# Patient Record
Sex: Male | Born: 1965 | Race: White | Hispanic: No | Marital: Married | Smoking: Never smoker
Health system: Southern US, Community
[De-identification: ages and names within clinical notes are randomized; demographics above are authoritative.]

## PROBLEM LIST (undated history)

## (undated) DIAGNOSIS — I10 Essential (primary) hypertension: Secondary | ICD-10-CM

## (undated) HISTORY — PX: ORCHIECTOMY: SHX2116

---

## 1991-03-18 DIAGNOSIS — C6290 Malignant neoplasm of unspecified testis, unspecified whether descended or undescended: Secondary | ICD-10-CM

## 1991-03-18 HISTORY — DX: Malignant neoplasm of unspecified testis, unspecified whether descended or undescended: C62.90

## 2007-03-18 HISTORY — PX: ELBOW ARTHRODESIS: SUR51

## 2009-11-15 ENCOUNTER — Ambulatory Visit (HOSPITAL_BASED_OUTPATIENT_CLINIC_OR_DEPARTMENT_OTHER): Admission: RE | Admit: 2009-11-15 | Discharge: 2009-11-15 | Payer: Self-pay | Admitting: Orthopedic Surgery

## 2010-05-30 LAB — POCT HEMOGLOBIN-HEMACUE: Hemoglobin: 14.6 g/dL (ref 13.0–17.0)

## 2014-02-03 ENCOUNTER — Emergency Department (INDEPENDENT_AMBULATORY_CARE_PROVIDER_SITE_OTHER)
Admission: EM | Admit: 2014-02-03 | Discharge: 2014-02-03 | Disposition: A | Discharge status: Home / Self Care | Payer: Worker's Compensation | Source: Home / Self Care | Attending: Emergency Medicine | Admitting: Emergency Medicine

## 2014-02-03 ENCOUNTER — Encounter: Payer: Self-pay | Admitting: Emergency Medicine

## 2014-02-03 DIAGNOSIS — S39012A Strain of muscle, fascia and tendon of lower back, initial encounter: Secondary | ICD-10-CM

## 2014-02-03 HISTORY — DX: Essential (primary) hypertension: I10

## 2014-02-03 MED ORDER — METHOCARBAMOL 500 MG PO TABS: 500.0000 mg | ORAL_TABLET | Freq: Two times a day (BID) | ORAL | Status: AC

## 2014-02-03 MED ORDER — IBUPROFEN 800 MG PO TABS: 800.0000 mg | ORAL_TABLET | Freq: Three times a day (TID) | ORAL | Status: AC

## 2014-02-03 MED ORDER — HYDROCODONE-ACETAMINOPHEN 5-325 MG PO TABS
2.0000 | ORAL_TABLET | ORAL | Status: DC | PRN
Start: 2014-02-03 — End: 2019-05-11

## 2014-02-03 MED ORDER — KETOROLAC TROMETHAMINE 60 MG/2ML IM SOLN
60.0000 mg | Freq: Once | INTRAMUSCULAR | Status: AC
  Administered 2014-02-03: 60 mg via INTRAMUSCULAR

## 2014-02-03 NOTE — ED Notes (Signed)
Patient was lifting tires and twisting body.  Felt sharp pain to left lower back shooting down left leg.

## 2014-02-03 NOTE — ED Provider Notes (Signed)
CSN: [id number]     Arrival date & time 02/03/14  5329 History   First MD Initiated Contact with Patient 02/03/14 1939     Chief Complaint  Patient presents with  . Back Pain   (Consider location/radiation/quality/duration/timing/severity/associated sxs/prior Treatment) Patient is a 48 y.o. male presenting with back pain. The history is provided by the patient. No language interpreter was used.  Back Pain Location:  Lumbar spine Quality:  Aching and stabbing Radiates to:  L posterior upper leg Pain severity:  Severe Pain is:  Same all the time Onset quality:  Sudden Timing:  Constant Progression:  Worsening Chronicity:  New Context: not recent illness   Relieved by:  Nothing Worsened by:  Nothing tried Ineffective treatments:  None tried Associated symptoms: no abdominal pain   Risk factors: no recent surgery     Past Medical History  Diagnosis Date  . Hypertension    History reviewed. No pertinent past surgical history. History reviewed. No pertinent family history. History  Substance Use Topics  . Smoking status: Never Smoker   . Smokeless tobacco: Not on file  . Alcohol Use: No    Review of Systems  Gastrointestinal: Negative for abdominal pain.  Musculoskeletal: Positive for back pain.  All other systems reviewed and are negative.   Allergies  Review of patient's allergies indicates no known allergies.  Home Medications   Prior to Admission medications   Medication Sig Start Date End Date Taking? Authorizing Provider  LISINOPRIL PO Take by mouth.   Yes Historical Provider, MD   BP 163/105 mmHg  Pulse 61  Temp(Src) 98.2 F (36.8 C) (Oral)  Resp 16  Ht 5\' 9"  (1.753 m)  Wt 175 lb (79.379 kg)  BMI 25.83 kg/m2  SpO2 99% Physical Exam  Constitutional: He appears well-developed and well-nourished.  HENT:  Head: Normocephalic and atraumatic.  Cardiovascular: Normal rate.   Pulmonary/Chest: Effort normal.  Abdominal: Soft.  Musculoskeletal: He  exhibits tenderness.  Tender lower thoracic and upper lumbar area,  Pain with range of motion, decreased range of motion nv and ns intact,    Neurological: He is alert.  Skin: Skin is warm.  Nursing note and vitals reviewed.   ED Course  Procedures (including critical care time) Labs Review Labs Reviewed - No data to display  Imaging Review No results found.   MDM   1. Lumbar spine strain, initial encounter    Hydrocodone Ibuprofen Robaxin Referral to Dr. Darene Lamer  For recheck in 3 weeks.    [place], PA-C 02/03/14 1955

## 2014-02-03 NOTE — Discharge Instructions (Signed)
Back Pain, Adult Low back pain is very common. About 1 in 5 people have back pain.The cause of low back pain is rarely dangerous. The pain often gets better over time.About half of people with a sudden onset of back pain feel better in just 2 weeks. About 8 in 10 people feel better by 6 weeks.  CAUSES Some common causes of back pain include:  Strain of the muscles or ligaments supporting the spine.  Wear and tear (degeneration) of the spinal discs.  Arthritis.  Direct injury to the back. DIAGNOSIS Most of the time, the direct cause of low back pain is not known.However, back pain can be treated effectively even when the exact cause of the pain is unknown.Answering your caregiver's questions about your overall health and symptoms is one of the most accurate ways to make sure the cause of your pain is not dangerous. If your caregiver needs more information, he or she may order lab work or imaging tests (X-rays or MRIs).However, even if imaging tests show changes in your back, this usually does not require surgery. HOME CARE INSTRUCTIONS For many people, back pain returns.Since low back pain is rarely dangerous, it is often a condition that people can learn to manageon their own.   Remain active. It is stressful on the back to sit or stand in one place. Do not sit, drive, or stand in one place for more than 30 minutes at a time. Take short walks on level surfaces as soon as pain allows.Try to increase the length of time you walk each day.  Do not stay in bed.Resting more than 1 or 2 days can delay your recovery.  Do not avoid exercise or work.Your body is made to move.It is not dangerous to be active, even though your back may hurt.Your back will likely heal faster if you return to being active before your pain is gone.  Pay attention to your body when you bend and lift. Many people have less discomfortwhen lifting if they bend their knees, keep the load close to their bodies,and  avoid twisting. Often, the most comfortable positions are those that put less stress on your recovering back.  Find a comfortable position to sleep. Use a firm mattress and lie on your side with your knees slightly bent. If you lie on your back, put a pillow under your knees.  Only take over-the-counter or prescription medicines as directed by your caregiver. Over-the-counter medicines to reduce pain and inflammation are often the most helpful.Your caregiver may prescribe muscle relaxant drugs.These medicines help dull your pain so you can more quickly return to your normal activities and healthy exercise.  Put ice on the injured area.  Put ice in a plastic bag.  Place a towel between your skin and the bag.  Leave the ice on for 15-20 minutes, 03-04 times a day for the first 2 to 3 days. After that, ice and heat may be alternated to reduce pain and spasms.  Ask your caregiver about trying back exercises and gentle massage. This may be of some benefit.  Avoid feeling anxious or stressed.Stress increases muscle tension and can worsen back pain.It is important to recognize when you are anxious or stressed and learn ways to manage it.Exercise is a great option. SEEK MEDICAL CARE IF:  You have pain that is not relieved with rest or medicine.  You have pain that does not improve in 1 week.  You have new symptoms.  You are generally not feeling well. SEEK   IMMEDIATE MEDICAL CARE IF:   You have pain that radiates from your back into your legs.  You develop new bowel or bladder control problems.  You have unusual weakness or numbness in your arms or legs.  You develop nausea or vomiting.  You develop abdominal pain.  You feel faint. Document Released: 03/03/2005 Document Revised: 09/02/2011 Document Reviewed: 07/05/2013 ExitCare Patient Information 2015 [employer]. This information is not intended to replace advice given to you by your health care provider. Make sure you  discuss any questions you have with your health care provider.  

## 2015-03-18 DIAGNOSIS — Z9889 Other specified postprocedural states: Secondary | ICD-10-CM

## 2015-03-18 HISTORY — DX: Other specified postprocedural states: Z98.890

## 2018-07-06 DIAGNOSIS — G894 Chronic pain syndrome: Secondary | ICD-10-CM | POA: Diagnosis not present

## 2018-07-06 DIAGNOSIS — M47812 Spondylosis without myelopathy or radiculopathy, cervical region: Secondary | ICD-10-CM | POA: Diagnosis not present

## 2018-07-06 DIAGNOSIS — M7918 Myalgia, other site: Secondary | ICD-10-CM | POA: Diagnosis not present

## 2018-07-13 DIAGNOSIS — M47812 Spondylosis without myelopathy or radiculopathy, cervical region: Secondary | ICD-10-CM | POA: Diagnosis not present

## 2018-09-01 DIAGNOSIS — M47812 Spondylosis without myelopathy or radiculopathy, cervical region: Secondary | ICD-10-CM | POA: Diagnosis not present

## 2018-09-30 DIAGNOSIS — G894 Chronic pain syndrome: Secondary | ICD-10-CM | POA: Diagnosis not present

## 2018-09-30 DIAGNOSIS — Z79899 Other long term (current) drug therapy: Secondary | ICD-10-CM | POA: Diagnosis not present

## 2018-09-30 DIAGNOSIS — M47812 Spondylosis without myelopathy or radiculopathy, cervical region: Secondary | ICD-10-CM | POA: Diagnosis not present

## 2018-11-02 DIAGNOSIS — M47812 Spondylosis without myelopathy or radiculopathy, cervical region: Secondary | ICD-10-CM | POA: Diagnosis not present

## 2018-11-02 DIAGNOSIS — G894 Chronic pain syndrome: Secondary | ICD-10-CM | POA: Diagnosis not present

## 2018-11-02 DIAGNOSIS — Z5181 Encounter for therapeutic drug level monitoring: Secondary | ICD-10-CM | POA: Diagnosis not present

## 2018-11-02 DIAGNOSIS — Z79899 Other long term (current) drug therapy: Secondary | ICD-10-CM | POA: Diagnosis not present

## 2018-12-01 DIAGNOSIS — M7918 Myalgia, other site: Secondary | ICD-10-CM | POA: Diagnosis not present

## 2018-12-01 DIAGNOSIS — G894 Chronic pain syndrome: Secondary | ICD-10-CM | POA: Diagnosis not present

## 2018-12-01 DIAGNOSIS — M47812 Spondylosis without myelopathy or radiculopathy, cervical region: Secondary | ICD-10-CM | POA: Diagnosis not present

## 2018-12-01 DIAGNOSIS — Z79899 Other long term (current) drug therapy: Secondary | ICD-10-CM | POA: Diagnosis not present

## 2019-01-26 DIAGNOSIS — Z981 Arthrodesis status: Secondary | ICD-10-CM | POA: Diagnosis not present

## 2019-01-26 DIAGNOSIS — G894 Chronic pain syndrome: Secondary | ICD-10-CM | POA: Diagnosis not present

## 2019-01-26 DIAGNOSIS — M5412 Radiculopathy, cervical region: Secondary | ICD-10-CM | POA: Diagnosis not present

## 2019-02-18 DIAGNOSIS — M4312 Spondylolisthesis, cervical region: Secondary | ICD-10-CM | POA: Diagnosis not present

## 2019-02-18 DIAGNOSIS — M2578 Osteophyte, vertebrae: Secondary | ICD-10-CM | POA: Diagnosis not present

## 2019-02-18 DIAGNOSIS — M4722 Other spondylosis with radiculopathy, cervical region: Secondary | ICD-10-CM | POA: Diagnosis not present

## 2019-02-18 DIAGNOSIS — M4802 Spinal stenosis, cervical region: Secondary | ICD-10-CM | POA: Diagnosis not present

## 2019-02-18 DIAGNOSIS — M5412 Radiculopathy, cervical region: Secondary | ICD-10-CM | POA: Diagnosis not present

## 2019-02-18 DIAGNOSIS — Z981 Arthrodesis status: Secondary | ICD-10-CM | POA: Diagnosis not present

## 2019-02-23 DIAGNOSIS — G894 Chronic pain syndrome: Secondary | ICD-10-CM | POA: Diagnosis not present

## 2019-02-23 DIAGNOSIS — M5412 Radiculopathy, cervical region: Secondary | ICD-10-CM | POA: Diagnosis not present

## 2019-03-17 DIAGNOSIS — C44622 Squamous cell carcinoma of skin of right upper limb, including shoulder: Secondary | ICD-10-CM | POA: Diagnosis not present

## 2019-04-20 DIAGNOSIS — I10 Essential (primary) hypertension: Secondary | ICD-10-CM | POA: Diagnosis not present

## 2019-04-20 DIAGNOSIS — Z79899 Other long term (current) drug therapy: Secondary | ICD-10-CM | POA: Diagnosis not present

## 2019-04-20 DIAGNOSIS — I160 Hypertensive urgency: Secondary | ICD-10-CM | POA: Diagnosis not present

## 2019-04-20 DIAGNOSIS — Z5181 Encounter for therapeutic drug level monitoring: Secondary | ICD-10-CM | POA: Diagnosis not present

## 2019-04-20 DIAGNOSIS — R519 Headache, unspecified: Secondary | ICD-10-CM | POA: Diagnosis not present

## 2019-04-20 DIAGNOSIS — J181 Lobar pneumonia, unspecified organism: Secondary | ICD-10-CM | POA: Diagnosis not present

## 2019-04-20 DIAGNOSIS — G894 Chronic pain syndrome: Secondary | ICD-10-CM | POA: Diagnosis not present

## 2019-04-20 DIAGNOSIS — I119 Hypertensive heart disease without heart failure: Secondary | ICD-10-CM | POA: Diagnosis not present

## 2019-04-20 DIAGNOSIS — M5412 Radiculopathy, cervical region: Secondary | ICD-10-CM | POA: Diagnosis not present

## 2019-04-20 DIAGNOSIS — R079 Chest pain, unspecified: Secondary | ICD-10-CM | POA: Diagnosis not present

## 2019-04-20 DIAGNOSIS — R918 Other nonspecific abnormal finding of lung field: Secondary | ICD-10-CM | POA: Diagnosis not present

## 2019-04-20 DIAGNOSIS — Z981 Arthrodesis status: Secondary | ICD-10-CM | POA: Diagnosis not present

## 2019-04-20 DIAGNOSIS — Z20822 Contact with and (suspected) exposure to covid-19: Secondary | ICD-10-CM | POA: Diagnosis not present

## 2019-04-20 DIAGNOSIS — Z8249 Family history of ischemic heart disease and other diseases of the circulatory system: Secondary | ICD-10-CM | POA: Diagnosis not present

## 2019-04-20 DIAGNOSIS — E039 Hypothyroidism, unspecified: Secondary | ICD-10-CM | POA: Diagnosis not present

## 2019-04-20 DIAGNOSIS — R0602 Shortness of breath: Secondary | ICD-10-CM | POA: Diagnosis not present

## 2019-04-21 DIAGNOSIS — R519 Headache, unspecified: Secondary | ICD-10-CM | POA: Diagnosis not present

## 2019-04-21 DIAGNOSIS — R079 Chest pain, unspecified: Secondary | ICD-10-CM | POA: Diagnosis not present

## 2019-04-21 DIAGNOSIS — I340 Nonrheumatic mitral (valve) insufficiency: Secondary | ICD-10-CM | POA: Diagnosis not present

## 2019-04-21 DIAGNOSIS — Z8249 Family history of ischemic heart disease and other diseases of the circulatory system: Secondary | ICD-10-CM | POA: Diagnosis not present

## 2019-04-21 DIAGNOSIS — I517 Cardiomegaly: Secondary | ICD-10-CM | POA: Diagnosis not present

## 2019-04-21 DIAGNOSIS — R0600 Dyspnea, unspecified: Secondary | ICD-10-CM | POA: Diagnosis not present

## 2019-04-21 DIAGNOSIS — I160 Hypertensive urgency: Secondary | ICD-10-CM | POA: Diagnosis not present

## 2019-04-22 DIAGNOSIS — Z1211 Encounter for screening for malignant neoplasm of colon: Secondary | ICD-10-CM | POA: Diagnosis not present

## 2019-04-22 DIAGNOSIS — I10 Essential (primary) hypertension: Secondary | ICD-10-CM | POA: Diagnosis not present

## 2019-04-27 DIAGNOSIS — I10 Essential (primary) hypertension: Secondary | ICD-10-CM | POA: Diagnosis not present

## 2019-05-11 ENCOUNTER — Ambulatory Visit: Payer: BC Managed Care – PPO | Admitting: Cardiology

## 2019-05-11 ENCOUNTER — Encounter: Payer: Self-pay | Admitting: Cardiology

## 2019-05-11 ENCOUNTER — Other Ambulatory Visit: Payer: Self-pay

## 2019-05-11 VITALS — BP 123/98 | HR 70 | Temp 96.0°F | Ht 69.0 in | Wt 195.9 lb

## 2019-05-11 DIAGNOSIS — R0602 Shortness of breath: Secondary | ICD-10-CM | POA: Diagnosis not present

## 2019-05-11 DIAGNOSIS — I10 Essential (primary) hypertension: Secondary | ICD-10-CM

## 2019-05-11 DIAGNOSIS — Z8249 Family history of ischemic heart disease and other diseases of the circulatory system: Secondary | ICD-10-CM

## 2019-05-11 MED ORDER — AMLODIPINE BESYLATE 5 MG PO TABS: 5.0000 mg | ORAL_TABLET | Freq: Every evening | ORAL | 0 refills | Status: DC

## 2019-05-11 MED ORDER — HYDROCHLOROTHIAZIDE 12.5 MG PO CAPS: 12.5000 mg | ORAL_CAPSULE | Freq: Every morning | ORAL | 0 refills | Status: DC

## 2019-05-11 NOTE — Patient Instructions (Signed)
Please remember to bring in your medication bottles in at the next visit.   New Medications that were added at today's visit:  Norvasc 5mg  po qPM HCTZ 12.5mg  po qAM  Medications that were discontinued at today's visit: Nifedipine Atenolol  Please keep a blood pressure log and bring that in at  your next office visit. Call the office if the top number is consistently greater than 169mmHg.   Please get labs done in about 1 week at the nearest [place].  Recommend follow up with your PCP as scheduled.

## 2019-05-11 NOTE — Progress Notes (Signed)
REASON FOR CONSULT: Hypertension, shortness of breath, ER follow-up  Chief Complaint  Patient presents with  . Shortness of Breath  . Hypertension  . Follow-up    REQUESTING PHYSICIAN:  Aura Dials, MD [address],  [place] [address]  HPI  Fred Coleman is a 54 y.o. male who presents to the office with a chief complaint of " shortness of breath, hypertension."  Patient's past medical history and cardiac risk factors include: Hypertension, diastolic dysfunction, premature coronary disease in family.  Benign essential hypertension. Patient states that he was diagnosed with hypertension at the age of 33 and he focused on lifestyle modifications and soon thereafter was taken off of antihypertensive medications.  Patient states this is been about couple years that he is not been on medical therapy for high blood pressures and recently found out in the mid 2020 that he is blood pressure is elevated.  Recently his blood pressures were significantly elevated and he had symptoms of headaches and vision changes for which she went to [place] system for further evaluation.  At the hospital he underwent an echocardiogram and nuclear stress test given his elevated blood pressures and effort related dyspnea.  He was later discharged and is now referred to the office at the request of his primary care physician for further evaluation and management of high blood pressure and shortness of breath.  His current antihypertensive medications include atenolol, lisinopril, nifedipine at the doses mentioned below.  Patient states that his blood pressure now at home is [id number] systolic and diastolic blood pressures range between 95-120 mmHg.    Review of systems positive for: Dyspnea on exertion (better than before). Currently patient denies chest pain, lightheadedness, dizziness, palpitations, orthopnea, paroxysmal nocturnal dyspnea, lower extremity swelling, near syncope, syncopal  events, hematochezia, hemoptysis, hematemesis, melanotic stools, no symptoms of amaurosis fugax, motor or sensory symptoms or dysphasia in the last 6 months.   History of premature coronary artery disease in the family. Denies prior history of coronary artery disease, myocardial infarction, congestive heart failure, deep venous thrombosis, pulmonary embolism, stroke, transient ischemic attack.  ALLERGIES: No Known Allergies   MEDICATION LIST PRIOR TO VISIT: Current Outpatient Medications on File Prior to Visit  Medication Sig Dispense Refill  . LISINOPRIL PO Take 5 mg by mouth 2 (two) times daily.     . methocarbamol (ROBAXIN) 500 MG tablet Take 1 tablet (500 mg total) by mouth 2 (two) times daily. 20 tablet 0  . ibuprofen (ADVIL,MOTRIN) 800 MG tablet Take 1 tablet (800 mg total) by mouth 3 (three) times daily. (Patient not taking: Reported on 05/11/2019) 21 tablet 0   No current facility-administered medications on file prior to visit.    PAST MEDICAL HISTORY: Past Medical History:  Diagnosis Date  . H/O neck surgery 2017  . Hypertension   . Testicular cancer ([place]) 1993    PAST SURGICAL HISTORY: Past Surgical History:  Procedure Laterality Date  . ELBOW ARTHRODESIS  2009  . ORCHIECTOMY      FAMILY HISTORY: The patient family history includes Heart attack in his sister; Heart disease in his father; Hyperlipidemia in his father; Hypertension in his father.   SOCIAL HISTORY:  The patient  reports that he has never smoked. He has never used smokeless tobacco. He reports that he does not drink alcohol or use drugs.  14 ORGAN REVIEW OF SYSTEMS: CONSTITUTIONAL: No fever or significant weight loss EYES: No recent significant visual change EARS, NOSE, MOUTH, THROAT:  No recent significant change in hearing CARDIOVASCULAR: See discussion in subjective/HPI RESPIRATORY: See discussion in subjective/HPI GASTROINTESTINAL: No recent complaints of abdominal pain GENITOURINARY: No recent  significant change in genitourinary status MUSCULOSKELETAL: No recent significant change in musculoskeletal status INTEGUMENTARY: No recent rash NEUROLOGIC: No recent significant change in motor function PSYCHIATRIC: No recent significant change in mood ENDOCRINOLOGIC: No recent significant change in endocrine status HEMATOLOGIC/LYMPHATIC: No recent significant unexpected bruising ALLERGIC/IMMUNOLOGIC: No recent unexplained allergic reaction  PHYSICAL EXAM: Vitals with BMI 05/11/2019 02/03/2014  Height 5\' 9"  5\' 9"   Weight 195 lbs 14 oz 175 lbs  BMI [id number]  Systolic [id number]  Diastolic 98 [id number]  Pulse 70 61   CONSTITUTIONAL: Well-developed and well-nourished. No acute distress.  SKIN: Skin is warm and dry. No rash noted. No cyanosis. No pallor. No jaundice HEAD: Normocephalic and atraumatic.  EYES: No scleral icterus MOUTH/THROAT: Moist oral membranes.  NECK: No JVD present. No thyromegaly noted. No carotid bruits  LYMPHATIC: No visible cervical adenopathy.  CHEST Normal respiratory effort. No intercostal retractions  LUNGS: Clear to auscultation bilaterally.  No stridor. No wheezes. No rales.  CARDIOVASCULAR: Regular rate and rhythm, positive S1-S2, no murmurs rubs or gallops appreciated. ABDOMINAL: No apparent ascites.  EXTREMITIES: Trace bilateral peripheral edema, 2+ bilateral radial, popliteal, posterior tibial pedis HEMATOLOGIC: No significant bruising NEUROLOGIC: Oriented to person, place, and time. Nonfocal. Normal muscle tone.  PSYCHIATRIC: Normal mood and affect. Normal behavior. Cooperative  CARDIAC DATABASE: EKG: 05/11/2019: Normal sinus rhythm with ventricular rate 65 bpm, nonspecific T wave changes, without underlying injury pattern.  Echocardiogram: 02/2021[facility]: The left ventricular systolic function was normal with an EF of 60-65%. The diastolic function was abnormal.There was no valvular pathology.There was mild concentric hypertrophy  Stress  Testing:  04/21/2019: Normal perfusion exam. Normal wall motion exam. Calculated LVEF of 78 %.  Heart Catheterization: None  Carotid duplex:None  LABORATORY DATA: External labs from [facility]: WBC 11.1, hemoglobin 14.7, platelets 269, sodium 137, potassium 4.1, bicarb 23, chloride 103, BUN 16, creatinine 0.96.  NT proBNP 30    FINAL MEDICATION LIST END OF ENCOUNTER: Meds ordered this encounter  Medications  . amLODipine (NORVASC) 5 MG tablet    Sig: Take 1 tablet (5 mg total) by mouth every evening.    Dispense:  90 tablet    Refill:  0  . hydrochlorothiazide (MICROZIDE) 12.5 MG capsule    Sig: Take 1 capsule (12.5 mg total) by mouth every morning.    Dispense:  90 capsule    Refill:  0    Medications Discontinued During This Encounter  Medication Reason  . HYDROcodone-acetaminophen (NORCO/VICODIN) 5-325 MG per tablet Error  . atenolol (TENORMIN) 50 MG tablet Change in therapy  . NIFEdipine (PROCARDIA-XL/NIFEDICAL-XL) 30 MG 24 hr tablet Change in therapy     Current Outpatient Medications:  .  LISINOPRIL PO, Take 5 mg by mouth 2 (two) times daily. , Disp: , Rfl:  .  methocarbamol (ROBAXIN) 500 MG tablet, Take 1 tablet (500 mg total) by mouth 2 (two) times daily., Disp: 20 tablet, Rfl: 0 .  amLODipine (NORVASC) 5 MG tablet, Take 1 tablet (5 mg total) by mouth every evening., Disp: 90 tablet, Rfl: 0 .  hydrochlorothiazide (MICROZIDE) 12.5 MG capsule, Take 1 capsule (12.5 mg total) by mouth every morning., Disp: 90 capsule, Rfl: 0 .  ibuprofen (ADVIL,MOTRIN) 800 MG tablet, Take 1 tablet (800 mg total) by mouth 3 (three) times daily. (Patient not taking: Reported on 05/11/2019), Disp: 21 tablet,  Rfl: 0  IMPRESSION:    ICD-10-CM   1. Benign hypertension  I10 amLODipine (NORVASC) 5 MG tablet    hydrochlorothiazide (MICROZIDE) 12.5 MG capsule    Basic Metabolic Panel (BMET)    Magnesium  2. Shortness of breath  R06.02 EKG 12-Lead    hydrochlorothiazide (MICROZIDE) 12.5 MG  capsule    Basic Metabolic Panel (BMET)    Magnesium  3. Family history of premature CAD  Z82.49      RECOMMENDATIONS: Benign essential hypertension: Discontinue atenolol and nifedipine. We will start hydrochlorothiazide 12.5 mg p.o. every morning. We will start Norvasc 5 mg p.o. every afternoon. Blood work in 1 week to evaluate kidney function and electrolytes. Patient is encouraged to keep a log of his blood pressures and to bring in the next office visit. 2-week follow-up visit requested to call the office sooner for systolic blood pressures consistently greater than 140 mmHg.  Shortness of breath: Most likely secondary to uncontrolled hypertension. Started on hydrochlorothiazide. We will continue to monitor. Patient had an echocardiogram and nuclear stress test at [facility] health results noted above.  Did not have images for review.  Family history of premature coronary artery disease: Continue aggressive risk factor modifications.  Orders Placed This Encounter  Procedures  . Basic Metabolic Panel (BMET)  . Magnesium  . EKG 12-Lead   --Continue cardiac medications as reconciled in final medication list. --Return in about 2 weeks (around 05/25/2019) for Discussion of test results and blood pressure.. Or sooner if needed. --Continue follow-up with your primary care physician regarding the management of your other chronic comorbid conditions.  Patient's questions and concerns were addressed to his satisfaction. He voices understanding of the instructions provided during this encounter.   This note was created using a voice recognition software as a result there may be grammatical errors inadvertently enclosed that do not reflect the nature of this encounter. Every attempt is made to correct such errors.  Rex Kras, DO, [facility]. [place] Office: [phone]

## 2019-05-18 ENCOUNTER — Other Ambulatory Visit: Payer: Self-pay | Admitting: Cardiology

## 2019-05-18 ENCOUNTER — Encounter: Payer: Self-pay | Admitting: Cardiology

## 2019-05-18 ENCOUNTER — Telehealth: Payer: Self-pay

## 2019-05-18 DIAGNOSIS — R0602 Shortness of breath: Secondary | ICD-10-CM

## 2019-05-18 DIAGNOSIS — Z981 Arthrodesis status: Secondary | ICD-10-CM | POA: Diagnosis not present

## 2019-05-18 DIAGNOSIS — I10 Essential (primary) hypertension: Secondary | ICD-10-CM

## 2019-05-18 DIAGNOSIS — G894 Chronic pain syndrome: Secondary | ICD-10-CM | POA: Diagnosis not present

## 2019-05-18 DIAGNOSIS — M5412 Radiculopathy, cervical region: Secondary | ICD-10-CM | POA: Diagnosis not present

## 2019-05-18 NOTE — Telephone Encounter (Signed)
Fred Coleman needs a letter saying that he has no restrictions from a cardiac standpoint for his job

## 2019-05-18 NOTE — Telephone Encounter (Signed)
Please ask him what kind of work he does in his daily duties.  Also ask him how his blood pressures have been doing since the changes in medications.  He is also supposed to get blood work 1 week after the change in medications which currently are not available for review.  ST

## 2019-05-20 ENCOUNTER — Encounter: Payer: Self-pay | Admitting: Cardiology

## 2019-05-25 ENCOUNTER — Ambulatory Visit: Payer: BC Managed Care – PPO | Admitting: Cardiology

## 2019-05-27 ENCOUNTER — Ambulatory Visit: Payer: BC Managed Care – PPO | Admitting: Cardiology

## 2019-06-28 ENCOUNTER — Other Ambulatory Visit: Payer: Self-pay | Admitting: Cardiology

## 2019-07-13 DIAGNOSIS — M47812 Spondylosis without myelopathy or radiculopathy, cervical region: Secondary | ICD-10-CM | POA: Diagnosis not present

## 2019-07-13 DIAGNOSIS — G894 Chronic pain syndrome: Secondary | ICD-10-CM | POA: Diagnosis not present

## 2019-07-13 DIAGNOSIS — M7918 Myalgia, other site: Secondary | ICD-10-CM | POA: Diagnosis not present

## 2019-07-13 DIAGNOSIS — Z981 Arthrodesis status: Secondary | ICD-10-CM | POA: Diagnosis not present

## 2019-08-06 ENCOUNTER — Other Ambulatory Visit: Payer: Self-pay | Admitting: Cardiology

## 2019-08-06 DIAGNOSIS — I10 Essential (primary) hypertension: Secondary | ICD-10-CM

## 2019-08-06 DIAGNOSIS — R0602 Shortness of breath: Secondary | ICD-10-CM

## 2019-09-07 DIAGNOSIS — G894 Chronic pain syndrome: Secondary | ICD-10-CM | POA: Diagnosis not present

## 2019-09-07 DIAGNOSIS — M5412 Radiculopathy, cervical region: Secondary | ICD-10-CM | POA: Diagnosis not present

## 2019-09-07 DIAGNOSIS — Z981 Arthrodesis status: Secondary | ICD-10-CM | POA: Diagnosis not present

## 2019-11-02 DIAGNOSIS — Z79899 Other long term (current) drug therapy: Secondary | ICD-10-CM | POA: Diagnosis not present

## 2019-11-02 DIAGNOSIS — M7918 Myalgia, other site: Secondary | ICD-10-CM | POA: Diagnosis not present

## 2019-11-02 DIAGNOSIS — M47812 Spondylosis without myelopathy or radiculopathy, cervical region: Secondary | ICD-10-CM | POA: Diagnosis not present

## 2019-11-02 DIAGNOSIS — Z5181 Encounter for therapeutic drug level monitoring: Secondary | ICD-10-CM | POA: Diagnosis not present

## 2019-11-02 DIAGNOSIS — G894 Chronic pain syndrome: Secondary | ICD-10-CM | POA: Diagnosis not present

## 2019-12-07 DIAGNOSIS — I10 Essential (primary) hypertension: Secondary | ICD-10-CM | POA: Diagnosis not present

## 2019-12-28 DIAGNOSIS — G894 Chronic pain syndrome: Secondary | ICD-10-CM | POA: Diagnosis not present

## 2019-12-28 DIAGNOSIS — G4486 Cervicogenic headache: Secondary | ICD-10-CM | POA: Diagnosis not present

## 2019-12-28 DIAGNOSIS — I160 Hypertensive urgency: Secondary | ICD-10-CM | POA: Diagnosis not present

## 2019-12-28 DIAGNOSIS — M47812 Spondylosis without myelopathy or radiculopathy, cervical region: Secondary | ICD-10-CM | POA: Diagnosis not present

## 2019-12-28 DIAGNOSIS — M5481 Occipital neuralgia: Secondary | ICD-10-CM | POA: Diagnosis not present

## 2020-02-11 DIAGNOSIS — Z20822 Contact with and (suspected) exposure to covid-19: Secondary | ICD-10-CM | POA: Diagnosis not present

## 2020-02-21 ENCOUNTER — Other Ambulatory Visit: Payer: Self-pay

## 2020-02-21 ENCOUNTER — Emergency Department
Admission: RE | Admit: 2020-02-21 | Discharge: 2020-02-21 | Disposition: A | Discharge status: Home / Self Care | Payer: BC Managed Care – PPO | Source: Ambulatory Visit

## 2020-02-21 ENCOUNTER — Emergency Department (INDEPENDENT_AMBULATORY_CARE_PROVIDER_SITE_OTHER): Payer: BC Managed Care – PPO

## 2020-02-21 VITALS — BP 169/104 | HR 79 | Temp 98.4°F | Resp 16

## 2020-02-21 DIAGNOSIS — U071 COVID-19: Secondary | ICD-10-CM

## 2020-02-21 DIAGNOSIS — R030 Elevated blood-pressure reading, without diagnosis of hypertension: Secondary | ICD-10-CM

## 2020-02-21 DIAGNOSIS — R059 Cough, unspecified: Secondary | ICD-10-CM | POA: Diagnosis not present

## 2020-02-21 DIAGNOSIS — H66003 Acute suppurative otitis media without spontaneous rupture of ear drum, bilateral: Secondary | ICD-10-CM

## 2020-02-21 MED ORDER — PREDNISONE 20 MG PO TABS: 20.0000 mg | ORAL_TABLET | Freq: Two times a day (BID) | ORAL | 0 refills | Status: DC

## 2020-02-21 MED ORDER — AMOXICILLIN 875 MG PO TABS: 875.0000 mg | ORAL_TABLET | Freq: Two times a day (BID) | ORAL | 0 refills | Status: AC

## 2020-02-21 MED ORDER — BENZONATATE 200 MG PO CAPS: 200.0000 mg | ORAL_CAPSULE | Freq: Two times a day (BID) | ORAL | 0 refills | Status: AC | PRN

## 2020-02-21 MED ORDER — PROMETHAZINE-DM 6.25-15 MG/5ML PO SYRP: 5.0000 mL | ORAL_SOLUTION | Freq: Four times a day (QID) | ORAL | 0 refills | Status: AC | PRN

## 2020-02-21 NOTE — ED Triage Notes (Signed)
Patient presents to Urgent Care with complaints of bilateral ear pain since two nights ago. Patient reports he recently got over covid (diagnosed thanksgiving weekend) and now has a productive cough in the morning and dry cough throughout the day. States both of his ears drained and bled yesterday.

## 2020-02-21 NOTE — Discharge Instructions (Addendum)
Take antibiotic 2 times a day Take the Tessalon 2 times a day At the cough syrup if you need to Take the prednisone 2 times a day Drink lots of water Get plenty of rest If worse at any time instead of better, go to ER

## 2020-02-21 NOTE — ED Provider Notes (Signed)
[facility]    CSN: [id number] Arrival date & time: 02/21/20  1555      History   Chief Complaint Chief Complaint  Patient presents with  . Appointment    4:00  . Otalgia  . Cough    HPI Fred Coleman is a 54 y.o. male.   HPI   Tested positive for COVID on 02/10/2020, 11 d ago.  Here for persistent severe cough.  Mild dyspnea.  Ear pain.  Decreased hearing.  Blood from both ears last night.  Cant sleep.  Headache.  Decreased energy and appetite. Has not been taking his BP meds.  Is reminded this is implortant Did not get infusion Did not get steroids  Past Medical History:  Diagnosis Date  . H/O neck surgery 2017  . Hypertension   . Testicular cancer ([place]) 1993    There are no problems to display for this patient.   Past Surgical History:  Procedure Laterality Date  . ELBOW ARTHRODESIS  2009  . ORCHIECTOMY         Home Medications    Prior to Admission medications   Medication Sig Start Date End Date Taking? Authorizing Provider  traMADol (ULTRAM) 50 MG tablet Take by mouth every 6 (six) hours as needed.   Yes [provider]  amLODipine (NORVASC) 5 MG tablet TAKE 1 TABLET BY MOUTH EVERY DAY IN THE EVENING 08/08/19   Tolia, Sunit, DO  amoxicillin (AMOXIL) 875 MG tablet Take 1 tablet (875 mg total) by mouth 2 (two) times daily. 02/21/20   Raylene Everts, MD  benzonatate (TESSALON) 200 MG capsule Take 1 capsule (200 mg total) by mouth 2 (two) times daily as needed for cough. 02/21/20   Raylene Everts, MD  hydrochlorothiazide (MICROZIDE) 12.5 MG capsule TAKE 1 CAPSULE BY MOUTH EVERY DAY IN THE MORNING 08/08/19   Tolia, Sunit, DO  ibuprofen (ADVIL,MOTRIN) 800 MG tablet Take 1 tablet (800 mg total) by mouth 3 (three) times daily. Patient not taking: Reported on 05/11/2019 02/03/14   Fransico Meadow, PA-C  LISINOPRIL PO Take 5 mg by mouth 2 (two) times daily.     [provider]  methocarbamol (ROBAXIN) 500 MG tablet Take 1  tablet (500 mg total) by mouth 2 (two) times daily. 02/03/14   Fransico Meadow, PA-C  predniSONE (DELTASONE) 20 MG tablet Take 1 tablet (20 mg total) by mouth 2 (two) times daily with a meal. 02/21/20   Raylene Everts, MD  promethazine-dextromethorphan (PROMETHAZINE-DM) 6.25-15 MG/5ML syrup Take 5 mLs by mouth 4 (four) times daily as needed for cough. 02/21/20   Raylene Everts, MD    Family History Family History  Problem Relation Age of Onset  . Hyperlipidemia Father   . Hypertension Father   . Heart disease Father   . Heart attack Sister     Social History Social History   Tobacco Use  . Smoking status: Never Smoker  . Smokeless tobacco: Never Used  Vaping Use  . Vaping Use: Never used  Substance Use Topics  . Alcohol use: No  . Drug use: No     Allergies   Patient has no known allergies.   Review of Systems Review of Systems See HPI Physical Exam Triage Vital Signs ED Triage Vitals  Enc Vitals Group     BP 02/21/20 1611 (!) 169/104     Pulse Rate 02/21/20 1611 79     Resp 02/21/20 1611 16     Temp 02/21/20  1611 98.4 F (36.9 C)     Temp Source 02/21/20 1611 Oral     SpO2 02/21/20 1611 96 %     Weight --      Height --      Head Circumference --      Peak Flow --      Pain Score 02/21/20 1609 7     Pain Loc --      Pain Edu? --      Excl. in [place]? --    No data found.  Updated Vital Signs BP (!) 169/104 (BP Location: Right Arm)   Pulse 79   Temp 98.4 F (36.9 C) (Oral)   Resp 16   SpO2 96%      Physical Exam Constitutional:      General: He is not in acute distress.    Appearance: He is well-developed.  HENT:     Head: Normocephalic and atraumatic.     Right Ear: Ear canal and external ear normal.     Left Ear: Ear canal and external ear normal.     Ears:     Comments: Both TM are dull, red    Nose: Congestion present.     Mouth/Throat:     Mouth: Mucous membranes are moist.     Pharynx: No posterior oropharyngeal erythema.  Eyes:       Conjunctiva/sclera: Conjunctivae normal.     Pupils: Pupils are equal, round, and reactive to light.  Cardiovascular:     Rate and Rhythm: Normal rate and regular rhythm.     Heart sounds: Normal heart sounds.  Pulmonary:     Effort: Pulmonary effort is normal. No respiratory distress.     Breath sounds: Normal breath sounds. No wheezing or rales.  Abdominal:     General: There is no distension.     Palpations: Abdomen is soft.  Musculoskeletal:        General: Normal range of motion.     Cervical back: Normal range of motion.  Lymphadenopathy:     Cervical: No cervical adenopathy.  Skin:    General: Skin is warm and dry.  Neurological:     Mental Status: He is alert.  Psychiatric:        Behavior: Behavior normal.      UC Treatments / Results  Labs (all labs ordered are listed, but only abnormal results are displayed) Labs Reviewed - No data to display  EKG   Radiology DG Chest 2 View  Result Date: 02/21/2020 CLINICAL DATA:  History of COVID positive with productive cough. EXAM: CHEST - 2 VIEW COMPARISON:  None. FINDINGS: Small, ill-defined focal areas of atelectasis and/or early infiltrate are seen within the lateral aspect of the left lung base and mid to upper right lung. There is no evidence of a pleural effusion or pneumothorax. The heart size and mediastinal contours are within normal limits. A radiopaque fusion plate and screws are seen overlying the cervical spine. Multiple surgical clips are seen within the medial aspect of the left upper quadrant. IMPRESSION: Very mild, bilateral atelectasis and/or early infiltrate, as described above. Electronically Signed   By: Virgina Norfolk M.D.   On: 02/21/2020 17:11    Procedures Procedures (including critical care time)  Medications Ordered in UC Medications - No data to display  Initial Impression / Assessment and Plan / UC Course  I have reviewed the triage vital signs and the nursing notes.  Pertinent labs  & imaging results that were available during  my care of the patient were reviewed by me and considered in my medical decision making (see chart for details).     We will treat the ear infection with amoxicillin We will treat the coughing with Tessalon.  Patient requests a syrup and this is given We will give 5 days of prednisone Patient is given a note to be off work the rest of the week Is urged to take his blood pressure medicine Is advised to go to the ER for rested better Final Clinical Impressions(s) / UC Diagnoses   Final diagnoses:  COVID-19 virus infection  Elevated blood pressure reading  Non-recurrent acute suppurative otitis media of both ears without spontaneous rupture of tympanic membranes     Discharge Instructions     Take antibiotic 2 times a day Take the Tessalon 2 times a day At the cough syrup if you need to Take the prednisone 2 times a day Drink lots of water Get plenty of rest If worse at any time instead of better, go to ER    ED Prescriptions    Medication Sig Dispense Auth. Provider   benzonatate (TESSALON) 200 MG capsule Take 1 capsule (200 mg total) by mouth 2 (two) times daily as needed for cough. 20 capsule Raylene Everts, MD   promethazine-dextromethorphan (PROMETHAZINE-DM) 6.25-15 MG/5ML syrup Take 5 mLs by mouth 4 (four) times daily as needed for cough. 118 mL Raylene Everts, MD   amoxicillin (AMOXIL) 875 MG tablet Take 1 tablet (875 mg total) by mouth 2 (two) times daily. 14 tablet Raylene Everts, MD   predniSONE (DELTASONE) 20 MG tablet Take 1 tablet (20 mg total) by mouth 2 (two) times daily with a meal. 10 tablet Raylene Everts, MD     I have reviewed the [place] during this encounter.   Raylene Everts, MD 02/21/20 [id number]

## 2020-02-22 DIAGNOSIS — M47812 Spondylosis without myelopathy or radiculopathy, cervical region: Secondary | ICD-10-CM | POA: Diagnosis not present

## 2020-02-22 DIAGNOSIS — M7918 Myalgia, other site: Secondary | ICD-10-CM | POA: Diagnosis not present

## 2020-02-22 DIAGNOSIS — I160 Hypertensive urgency: Secondary | ICD-10-CM | POA: Diagnosis not present

## 2020-02-22 DIAGNOSIS — G894 Chronic pain syndrome: Secondary | ICD-10-CM | POA: Diagnosis not present

## 2020-03-02 ENCOUNTER — Emergency Department (INDEPENDENT_AMBULATORY_CARE_PROVIDER_SITE_OTHER): Payer: BC Managed Care – PPO

## 2020-03-02 ENCOUNTER — Emergency Department
Admission: RE | Admit: 2020-03-02 | Discharge: 2020-03-02 | Disposition: A | Discharge status: Home / Self Care | Payer: BC Managed Care – PPO | Source: Ambulatory Visit

## 2020-03-02 ENCOUNTER — Other Ambulatory Visit: Payer: Self-pay

## 2020-03-02 VITALS — BP 155/114 | HR 84 | Temp 98.1°F | Resp 18 | Ht 70.0 in | Wt 195.0 lb

## 2020-03-02 DIAGNOSIS — U099 Post covid-19 condition, unspecified: Secondary | ICD-10-CM

## 2020-03-02 DIAGNOSIS — R053 Chronic cough: Secondary | ICD-10-CM | POA: Diagnosis not present

## 2020-03-02 DIAGNOSIS — H7292 Unspecified perforation of tympanic membrane, left ear: Secondary | ICD-10-CM

## 2020-03-02 DIAGNOSIS — R059 Cough, unspecified: Secondary | ICD-10-CM | POA: Diagnosis not present

## 2020-03-02 DIAGNOSIS — R062 Wheezing: Secondary | ICD-10-CM

## 2020-03-02 DIAGNOSIS — I10 Essential (primary) hypertension: Secondary | ICD-10-CM

## 2020-03-02 NOTE — Discharge Instructions (Addendum)
  Call to schedule a follow up appointment with primary care for ongoing management of your blood pressure and lingering COVID symptoms. If blood pressure remains poorly controlled, you are at increased risk of stroke, heart attack, kidney failure and early death.   Coughs can lasts weeks to months. You may also try scheduling an appointment with the [facility]. They can set up referrals to pulmonology and/or ENT if needed.   Call 911 or have someone drive you to the hospital if symptoms significantly worsening- severe chest pain, trouble breathing, dizziness/passing out, or other new concerning symptoms develop.

## 2020-03-02 NOTE — ED Triage Notes (Addendum)
Pt presents with recent hx of covid on nov 26th, following that he had double ear infections and pnemonia. Patient still presents with a cough and was told he could not return to work when he is still coughing. Patient co of decrease in hearing and taste and smell. Sob when walking up stairs. Pt was prescribed cough medication but was still coughing and was unable to go to work. Pt states he "feels better" despite the lingering cough.

## 2020-03-02 NOTE — ED Provider Notes (Signed)
[name]    CSN: [id number] Arrival date & time: 03/02/20  1356      History   Chief Complaint Chief Complaint  Patient presents with  . Cough  . Appointment    HPI Fred Coleman is a 54 y.o. male.   HPI  Fred Coleman is a 54 y.o. male presenting to UC with c/o continued cough that has improved since being dx with COVID on 02/10/20 but states work will not allow him to return with his cough.  He was tx on 02/21/20 for bilateral AOM with spontaneous rupture of Left TM. Pain has improved but sound is still muffled in Left ear. Denies fever, chills, n/v/d. No chest pain or SOB. He does have a PCP but has not f/u with PCP since initial COVID diagnosis.   BP elevated in triage. Hx of HTN. Denies HA or dizziness. Does not take his BP medication on a daily basis. He has not taken his BP medication today.   Past Medical History:  Diagnosis Date  . H/O neck surgery 2017  . Hypertension   . Testicular cancer ([place]) 1993    There are no problems to display for this patient.   Past Surgical History:  Procedure Laterality Date  . ELBOW ARTHRODESIS  2009  . ORCHIECTOMY         Home Medications    Prior to Admission medications   Medication Sig Start Date End Date Taking? Authorizing Provider  amLODipine (NORVASC) 5 MG tablet TAKE 1 TABLET BY MOUTH EVERY DAY IN THE EVENING 08/08/19   Tolia, Sunit, DO  amoxicillin (AMOXIL) 875 MG tablet Take 1 tablet (875 mg total) by mouth 2 (two) times daily. 02/21/20   Raylene Everts, MD  benzonatate (TESSALON) 200 MG capsule Take 1 capsule (200 mg total) by mouth 2 (two) times daily as needed for cough. 02/21/20   Raylene Everts, MD  hydrochlorothiazide (MICROZIDE) 12.5 MG capsule TAKE 1 CAPSULE BY MOUTH EVERY DAY IN THE MORNING 08/08/19   Tolia, Sunit, DO  ibuprofen (ADVIL,MOTRIN) 800 MG tablet Take 1 tablet (800 mg total) by mouth 3 (three) times daily. Patient not taking: Reported on 05/11/2019 02/03/14    Fransico Meadow, PA-C  LISINOPRIL PO Take 5 mg by mouth 2 (two) times daily.     [provider]  methocarbamol (ROBAXIN) 500 MG tablet Take 1 tablet (500 mg total) by mouth 2 (two) times daily. 02/03/14   Fransico Meadow, PA-C  predniSONE (DELTASONE) 20 MG tablet Take 1 tablet (20 mg total) by mouth 2 (two) times daily with a meal. 02/21/20   Raylene Everts, MD  promethazine-dextromethorphan (PROMETHAZINE-DM) 6.25-15 MG/5ML syrup Take 5 mLs by mouth 4 (four) times daily as needed for cough. 02/21/20   Raylene Everts, MD  traMADol Veatrice Bourbon) 50 MG tablet Take by mouth every 6 (six) hours as needed.    [provider]    Family History Family History  Problem Relation Age of Onset  . Hyperlipidemia Father   . Hypertension Father   . Heart disease Father   . Heart attack Sister     Social History Social History   Tobacco Use  . Smoking status: Never Smoker  . Smokeless tobacco: Never Used  Vaping Use  . Vaping Use: Never used  Substance Use Topics  . Alcohol use: Yes  . Drug use: No     Allergies   Patient has no known allergies.   Review of Systems  Review of Systems  Constitutional: Negative for chills and fever.  HENT: Positive for congestion and hearing loss (left). Negative for ear pain, sore throat, trouble swallowing and voice change.   Respiratory: Positive for cough. Negative for shortness of breath.   Cardiovascular: Negative for chest pain and palpitations.  Gastrointestinal: Negative for abdominal pain, diarrhea, nausea and vomiting.  Musculoskeletal: Negative for arthralgias, back pain and myalgias.  Skin: Negative for rash.  Neurological: Negative for dizziness, light-headedness and headaches.  All other systems reviewed and are negative.    Physical Exam Triage Vital Signs ED Triage Vitals  Enc Vitals Group     BP 03/02/20 1409 (!) 166/108     Pulse Rate 03/02/20 1409 84     Resp 03/02/20 1409 18     Temp 03/02/20 1409 98.1 F  (36.7 C)     Temp Source 03/02/20 1409 Oral     SpO2 03/02/20 1409 96 %     Weight 03/02/20 1407 195 lb (88.5 kg)     Height 03/02/20 1407 5\' 10"  (1.778 m)     Head Circumference --      Peak Flow --      Pain Score 03/02/20 1407 0     Pain Loc --      Pain Edu? --      Excl. in [place]? --    No data found.  Updated Vital Signs BP (!) 155/114 (BP Location: Left Arm)   Pulse 84   Temp 98.1 F (36.7 C) (Oral)   Resp 18   Ht 5\' 10"  (1.778 m)   Wt 195 lb (88.5 kg)   SpO2 96%   BMI 27.98 kg/m   Visual Acuity Right Eye Distance:   Left Eye Distance:   Bilateral Distance:    Right Eye Near:   Left Eye Near:    Bilateral Near:     Physical Exam Vitals and nursing note reviewed.  Constitutional:      Appearance: Normal appearance. He is well-developed and well-nourished.  HENT:     Head: Normocephalic and atraumatic.     Right Ear: Tympanic membrane is scarred.     Left Ear: Drainage (scant dried blood around perforated section of TM) present. Tympanic membrane is perforated and erythematous (faint).     Nose: Nose normal.     Right Sinus: No maxillary sinus tenderness or frontal sinus tenderness.     Left Sinus: No maxillary sinus tenderness or frontal sinus tenderness.     Mouth/Throat:     Lips: Pink.     Mouth: Mucous membranes are moist.     Pharynx: Oropharynx is clear. Uvula midline.  Eyes:     Extraocular Movements: EOM normal.  Cardiovascular:     Rate and Rhythm: Normal rate and regular rhythm.  Pulmonary:     Effort: Pulmonary effort is normal. No respiratory distress.     Breath sounds: Normal breath sounds. No stridor. No wheezing, rhonchi or rales.  Musculoskeletal:        General: Normal range of motion.     Cervical back: Normal range of motion and neck supple. No tenderness.  Lymphadenopathy:     Cervical: No cervical adenopathy.  Skin:    General: Skin is warm and dry.  Neurological:     Mental Status: He is alert and oriented to person, place,  and time.  Psychiatric:        Mood and Affect: Mood and affect normal.  Behavior: Behavior normal.      UC Treatments / Results  Labs (all labs ordered are listed, but only abnormal results are displayed) Labs Reviewed - No data to display  EKG   Radiology Narrative & Impression  CLINICAL DATA:  Cough, congestion, wheeze. History of COVID-19 infection.  EXAM: CHEST - 2 VIEW  COMPARISON:  02/21/2020  FINDINGS: The cardiomediastinal silhouette is within normal limits. The lungs are hypoinflated. Faint lung densities on the prior study have resolved. No airspace consolidation, edema, pleural effusion, pneumothorax is identified currently. Upper abdominal surgical clips and cervical spine fusion are noted.  IMPRESSION: No active cardiopulmonary disease.   Electronically Signed   By: Logan Bores M.D.   On: 03/02/2020 15:02     Procedures Procedures (including critical care time)  Medications Ordered in UC Medications - No data to display  Initial Impression / Assessment and Plan / UC Course  I have reviewed the triage vital signs and the nursing notes.  Pertinent labs & imaging results that were available during my care of the patient were reviewed by me and considered in my medical decision making (see chart for details).     Reassured pt his CXR is clear now, improved since last CXR which was c/w pneumonia. Left TM perforated, encouraged to f/u with PCP and/or ENT for continued monitoring. Avoid getting water in left ear, do not use drops or  instruments in the ear. Encouraged to monitor his BP and take medication as prescribed, f/u with PCP  Final Clinical Impressions(s) / UC Diagnoses   Final diagnoses:  Post-COVID chronic cough  Ruptured ear drum, left  Uncontrolled hypertension     Discharge Instructions      Call to schedule a follow up appointment with primary care for ongoing management of your blood pressure and lingering  COVID symptoms. If blood pressure remains poorly controlled, you are at increased risk of stroke, heart attack, kidney failure and early death.   Coughs can lasts weeks to months. You may also try scheduling an appointment with the [facility]. They can set up referrals to pulmonology and/or ENT if needed.   Call 911 or have someone drive you to the hospital if symptoms significantly worsening- severe chest pain, trouble breathing, dizziness/passing out, or other new concerning symptoms develop.     ED Prescriptions    None     PDMP not reviewed this encounter.   Noe Gens, PA-C 03/05/20 1027

## 2020-05-23 DIAGNOSIS — M47812 Spondylosis without myelopathy or radiculopathy, cervical region: Secondary | ICD-10-CM | POA: Diagnosis not present

## 2020-06-22 ENCOUNTER — Emergency Department (INDEPENDENT_AMBULATORY_CARE_PROVIDER_SITE_OTHER): Payer: BC Managed Care – PPO

## 2020-06-22 ENCOUNTER — Emergency Department
Admission: RE | Admit: 2020-06-22 | Discharge: 2020-06-22 | Disposition: A | Discharge status: Home / Self Care | Payer: BC Managed Care – PPO | Source: Ambulatory Visit

## 2020-06-22 ENCOUNTER — Other Ambulatory Visit: Payer: Self-pay

## 2020-06-22 VITALS — BP 149/103 | HR 87 | Temp 97.9°F | Resp 16

## 2020-06-22 DIAGNOSIS — M25561 Pain in right knee: Secondary | ICD-10-CM | POA: Diagnosis not present

## 2020-06-22 DIAGNOSIS — W19XXXA Unspecified fall, initial encounter: Secondary | ICD-10-CM | POA: Diagnosis not present

## 2020-06-22 DIAGNOSIS — S8991XA Unspecified injury of right lower leg, initial encounter: Secondary | ICD-10-CM | POA: Diagnosis not present

## 2020-06-22 MED ORDER — PREDNISONE 50 MG PO TABS: 50.0000 mg | ORAL_TABLET | Freq: Every day | ORAL | 0 refills | Status: AC

## 2020-06-22 NOTE — ED Triage Notes (Signed)
Patient presents to Urgent Care with complaints of right knee pain 1 week after a fall. Patient reports pain with weight bearing, walking with a limp. Taking Ibuprofen with little relief. Pain is worst with walking. Applied heat and ice.

## 2020-06-22 NOTE — Discharge Instructions (Addendum)
Imaging negative for fracture of knee.  Continue ACE wrapping or knee bracing. Take prednisone 50 mg daily x 5 days with breakfast.

## 2020-06-22 NOTE — ED Provider Notes (Signed)
[facility]    CSN: [id number] Arrival date & time: 06/22/20  1852      History   Chief Complaint Chief Complaint  Patient presents with  . Knee Pain    HPI Fred Coleman is a 55 y.o. male.   HPI Patient present today with ongoing right knee pain following a fall that occurred over 1 week ago. He has been taking ibuprofen without relief. He is chronically prescribed tramadol for chronic pain. He endorses abnormal gait since injury occurred. Pain is localized to the [facility] and feels a pulling sensation with flexing knee and with certain ambulation movements. Pain is mildest at rest. Past Medical History:  Diagnosis Date  . H/O neck surgery 2017  . Hypertension   . Testicular cancer ([place]) 1993    There are no problems to display for this patient.   Past Surgical History:  Procedure Laterality Date  . ELBOW ARTHRODESIS  2009  . ORCHIECTOMY         Home Medications    Prior to Admission medications   Medication Sig Start Date End Date Taking? Authorizing Provider  LISINOPRIL PO Take 5 mg by mouth 2 (two) times daily.    Yes [provider]  amLODipine (NORVASC) 5 MG tablet TAKE 1 TABLET BY MOUTH EVERY DAY IN THE EVENING 08/08/19   Tolia, Sunit, DO  amoxicillin (AMOXIL) 875 MG tablet Take 1 tablet (875 mg total) by mouth 2 (two) times daily. 02/21/20   Raylene Everts, MD  benzonatate (TESSALON) 200 MG capsule Take 1 capsule (200 mg total) by mouth 2 (two) times daily as needed for cough. 02/21/20   Raylene Everts, MD  hydrochlorothiazide (MICROZIDE) 12.5 MG capsule TAKE 1 CAPSULE BY MOUTH EVERY DAY IN THE MORNING 08/08/19   Tolia, Sunit, DO  ibuprofen (ADVIL,MOTRIN) 800 MG tablet Take 1 tablet (800 mg total) by mouth 3 (three) times daily. Patient not taking: Reported on 05/11/2019 02/03/14   Fransico Meadow, PA-C  methocarbamol (ROBAXIN) 500 MG tablet Take 1 tablet (500 mg total) by mouth 2 (two) times daily. 02/03/14   Fransico Meadow, PA-C   predniSONE (DELTASONE) 20 MG tablet Take 1 tablet (20 mg total) by mouth 2 (two) times daily with a meal. 02/21/20   Raylene Everts, MD  promethazine-dextromethorphan (PROMETHAZINE-DM) 6.25-15 MG/5ML syrup Take 5 mLs by mouth 4 (four) times daily as needed for cough. 02/21/20   Raylene Everts, MD  traMADol Veatrice Bourbon) 50 MG tablet Take by mouth every 6 (six) hours as needed.    [provider]    Family History Family History  Problem Relation Age of Onset  . Hyperlipidemia Father   . Hypertension Father   . Heart disease Father   . Heart attack Sister     Social History Social History   Tobacco Use  . Smoking status: Never Smoker  . Smokeless tobacco: Never Used  Vaping Use  . Vaping Use: Never used  Substance Use Topics  . Alcohol use: Yes  . Drug use: No     Allergies   Patient has no known allergies.   Review of Systems Review of Systems Pertinent negatives listed in HPI   Physical Exam Triage Vital Signs ED Triage Vitals  Enc Vitals Group     BP 06/22/20 1903 (!) 149/103     Pulse Rate 06/22/20 1903 87     Resp 06/22/20 1903 16     Temp 06/22/20 1903 97.9 F (36.6 C)  Temp Source 06/22/20 1903 Oral     SpO2 06/22/20 1903 97 %     Weight --      Height --      Head Circumference --      Peak Flow --      Pain Score 06/22/20 1902 2     Pain Loc --      Pain Edu? --      Excl. in [place]? --    No data found.  Updated Vital Signs BP (!) 149/103 (BP Location: Left Arm)   Pulse 87   Temp 97.9 F (36.6 C) (Oral)   Resp 16   SpO2 97%   Visual Acuity Right Eye Distance:   Left Eye Distance:   Bilateral Distance:    Right Eye Near:   Left Eye Near:    Bilateral Near:     Physical Exam Constitutional:      Appearance: Normal appearance.  HENT:     Head: Normocephalic.  Eyes:     General: Lids are normal.  Cardiovascular:     Rate and Rhythm: Normal rate and regular rhythm.  Pulmonary:     Effort: Pulmonary effort is normal.      Breath sounds: Normal breath sounds.  Musculoskeletal:     Comments: Tenderness with flexion at MCL. Negative soft tissue swelling. Negative patellar tenderness   Psychiatric:        Behavior: Behavior is cooperative.     UC Treatments / Results  Labs (all labs ordered are listed, but only abnormal results are displayed) Labs Reviewed - No data to display  EKG   Radiology DG Knee Complete 4 Views Right  Result Date: 06/22/2020 CLINICAL DATA:  [place] 1 week ago.  Persistent knee pain. EXAM: RIGHT KNEE - COMPLETE 4+ VIEW COMPARISON:  None. FINDINGS: The joint spaces are maintained. No acute fracture or osteochondral abnormality. No joint effusion. IMPRESSION: No acute bony findings or joint effusion. Electronically Signed   By: Marijo Sanes M.D.   On: 06/22/2020 19:46    Procedures Procedures (including critical care time)  Medications Ordered in UC Medications - No data to display  Initial Impression / Assessment and Plan / UC Course  I have reviewed the triage vital signs and the nursing notes.  Pertinent labs & imaging results that were available during my care of the patient were reviewed by me and considered in my medical decision making (see chart for details).     Imaging negative for fracture of misalignment. Trial a short course of prednisone and along with RICE method. Sports medicine/Orthopedics follow-up with no improvement. Work note provided for 3 days. Follow-up with PCP as needed Final Clinical Impressions(s) / UC Diagnoses   Final diagnoses:  Injury of right knee, initial encounter  Fall, initial encounter     Discharge Instructions     Imaging negative for fracture of knee.  Continue ACE wrapping or knee bracing. Take prednisone 50 mg daily x 5 days with breakfast.      ED Prescriptions    Medication Sig Dispense Auth. Provider   predniSONE (DELTASONE) 50 MG tablet Take 1 tablet (50 mg total) by mouth daily with breakfast for 5 days. 5 tablet  Scot Jun, FNP     PDMP not reviewed this encounter.   Scot Jun, FNP 06/25/20 [id number]

## 2020-06-25 DIAGNOSIS — M4802 Spinal stenosis, cervical region: Secondary | ICD-10-CM | POA: Diagnosis not present

## 2020-06-25 DIAGNOSIS — M2578 Osteophyte, vertebrae: Secondary | ICD-10-CM | POA: Diagnosis not present

## 2020-06-25 DIAGNOSIS — M47812 Spondylosis without myelopathy or radiculopathy, cervical region: Secondary | ICD-10-CM | POA: Diagnosis not present

## 2020-06-26 DIAGNOSIS — M25561 Pain in right knee: Secondary | ICD-10-CM | POA: Diagnosis not present

## 2020-06-26 DIAGNOSIS — I10 Essential (primary) hypertension: Secondary | ICD-10-CM | POA: Diagnosis not present

## 2020-06-27 DIAGNOSIS — Z981 Arthrodesis status: Secondary | ICD-10-CM | POA: Diagnosis not present

## 2020-06-27 DIAGNOSIS — M5412 Radiculopathy, cervical region: Secondary | ICD-10-CM | POA: Diagnosis not present

## 2020-06-27 DIAGNOSIS — M47812 Spondylosis without myelopathy or radiculopathy, cervical region: Secondary | ICD-10-CM | POA: Diagnosis not present

## 2020-08-22 DIAGNOSIS — Z5181 Encounter for therapeutic drug level monitoring: Secondary | ICD-10-CM | POA: Diagnosis not present

## 2020-08-22 DIAGNOSIS — M47812 Spondylosis without myelopathy or radiculopathy, cervical region: Secondary | ICD-10-CM | POA: Diagnosis not present

## 2020-08-22 DIAGNOSIS — Z79899 Other long term (current) drug therapy: Secondary | ICD-10-CM | POA: Diagnosis not present

## 2020-08-22 DIAGNOSIS — M5412 Radiculopathy, cervical region: Secondary | ICD-10-CM | POA: Diagnosis not present

## 2020-08-22 DIAGNOSIS — Z981 Arthrodesis status: Secondary | ICD-10-CM | POA: Diagnosis not present

## 2020-08-22 DIAGNOSIS — G894 Chronic pain syndrome: Secondary | ICD-10-CM | POA: Diagnosis not present

## 2020-08-31 DIAGNOSIS — M5412 Radiculopathy, cervical region: Secondary | ICD-10-CM | POA: Diagnosis not present

## 2020-08-31 DIAGNOSIS — E663 Overweight: Secondary | ICD-10-CM | POA: Diagnosis not present

## 2020-10-22 DIAGNOSIS — M5412 Radiculopathy, cervical region: Secondary | ICD-10-CM | POA: Diagnosis not present

## 2020-10-22 DIAGNOSIS — M47812 Spondylosis without myelopathy or radiculopathy, cervical region: Secondary | ICD-10-CM | POA: Diagnosis not present

## 2020-10-22 DIAGNOSIS — Z981 Arthrodesis status: Secondary | ICD-10-CM | POA: Diagnosis not present

## 2020-10-22 DIAGNOSIS — Z79899 Other long term (current) drug therapy: Secondary | ICD-10-CM | POA: Diagnosis not present

## 2020-12-25 DIAGNOSIS — Z79899 Other long term (current) drug therapy: Secondary | ICD-10-CM | POA: Diagnosis not present

## 2020-12-25 DIAGNOSIS — M47812 Spondylosis without myelopathy or radiculopathy, cervical region: Secondary | ICD-10-CM | POA: Diagnosis not present

## 2020-12-25 DIAGNOSIS — Z981 Arthrodesis status: Secondary | ICD-10-CM | POA: Diagnosis not present

## 2020-12-25 DIAGNOSIS — G894 Chronic pain syndrome: Secondary | ICD-10-CM | POA: Diagnosis not present

## 2021-04-02 DIAGNOSIS — M5412 Radiculopathy, cervical region: Secondary | ICD-10-CM | POA: Diagnosis not present

## 2021-04-02 DIAGNOSIS — Z79899 Other long term (current) drug therapy: Secondary | ICD-10-CM | POA: Diagnosis not present

## 2021-04-02 DIAGNOSIS — M25551 Pain in right hip: Secondary | ICD-10-CM | POA: Diagnosis not present

## 2021-04-02 DIAGNOSIS — M47812 Spondylosis without myelopathy or radiculopathy, cervical region: Secondary | ICD-10-CM | POA: Diagnosis not present

## 2021-04-29 DIAGNOSIS — M7061 Trochanteric bursitis, right hip: Secondary | ICD-10-CM | POA: Diagnosis not present

## 2021-04-29 DIAGNOSIS — M25552 Pain in left hip: Secondary | ICD-10-CM | POA: Diagnosis not present

## 2021-04-29 DIAGNOSIS — M7062 Trochanteric bursitis, left hip: Secondary | ICD-10-CM | POA: Diagnosis not present

## 2021-04-29 DIAGNOSIS — M25551 Pain in right hip: Secondary | ICD-10-CM | POA: Diagnosis not present

## 2021-05-17 DIAGNOSIS — I10 Essential (primary) hypertension: Secondary | ICD-10-CM | POA: Diagnosis not present

## 2021-06-12 DIAGNOSIS — Z1211 Encounter for screening for malignant neoplasm of colon: Secondary | ICD-10-CM | POA: Diagnosis not present

## 2021-06-12 DIAGNOSIS — I10 Essential (primary) hypertension: Secondary | ICD-10-CM | POA: Diagnosis not present

## 2021-06-12 DIAGNOSIS — Z125 Encounter for screening for malignant neoplasm of prostate: Secondary | ICD-10-CM | POA: Diagnosis not present

## 2021-06-12 DIAGNOSIS — R634 Abnormal weight loss: Secondary | ICD-10-CM | POA: Diagnosis not present

## 2021-06-18 DIAGNOSIS — Z79899 Other long term (current) drug therapy: Secondary | ICD-10-CM | POA: Diagnosis not present

## 2021-06-18 DIAGNOSIS — M47812 Spondylosis without myelopathy or radiculopathy, cervical region: Secondary | ICD-10-CM | POA: Diagnosis not present

## 2021-06-18 DIAGNOSIS — M5412 Radiculopathy, cervical region: Secondary | ICD-10-CM | POA: Diagnosis not present

## 2021-06-18 DIAGNOSIS — Z5181 Encounter for therapeutic drug level monitoring: Secondary | ICD-10-CM | POA: Diagnosis not present

## 2021-07-10 DIAGNOSIS — I10 Essential (primary) hypertension: Secondary | ICD-10-CM | POA: Diagnosis not present

## 2021-07-25 DIAGNOSIS — J069 Acute upper respiratory infection, unspecified: Secondary | ICD-10-CM | POA: Diagnosis not present

## 2021-07-25 DIAGNOSIS — Z03818 Encounter for observation for suspected exposure to other biological agents ruled out: Secondary | ICD-10-CM | POA: Diagnosis not present

## 2021-07-25 DIAGNOSIS — R509 Fever, unspecified: Secondary | ICD-10-CM | POA: Diagnosis not present

## 2021-07-25 DIAGNOSIS — R051 Acute cough: Secondary | ICD-10-CM | POA: Diagnosis not present

## 2021-07-25 DIAGNOSIS — J029 Acute pharyngitis, unspecified: Secondary | ICD-10-CM | POA: Diagnosis not present

## 2021-07-29 DIAGNOSIS — Z1211 Encounter for screening for malignant neoplasm of colon: Secondary | ICD-10-CM | POA: Diagnosis not present

## 2021-07-29 DIAGNOSIS — R1013 Epigastric pain: Secondary | ICD-10-CM | POA: Diagnosis not present

## 2021-07-29 DIAGNOSIS — K219 Gastro-esophageal reflux disease without esophagitis: Secondary | ICD-10-CM | POA: Diagnosis not present

## 2021-07-29 DIAGNOSIS — R634 Abnormal weight loss: Secondary | ICD-10-CM | POA: Diagnosis not present

## 2021-08-30 DIAGNOSIS — K2980 Duodenitis without bleeding: Secondary | ICD-10-CM | POA: Diagnosis not present

## 2021-08-30 DIAGNOSIS — K259 Gastric ulcer, unspecified as acute or chronic, without hemorrhage or perforation: Secondary | ICD-10-CM | POA: Diagnosis not present

## 2021-08-30 DIAGNOSIS — K2950 Unspecified chronic gastritis without bleeding: Secondary | ICD-10-CM | POA: Diagnosis not present

## 2021-08-30 DIAGNOSIS — Z1211 Encounter for screening for malignant neoplasm of colon: Secondary | ICD-10-CM | POA: Diagnosis not present

## 2021-08-30 DIAGNOSIS — K219 Gastro-esophageal reflux disease without esophagitis: Secondary | ICD-10-CM | POA: Diagnosis not present

## 2021-08-30 DIAGNOSIS — K648 Other hemorrhoids: Secondary | ICD-10-CM | POA: Diagnosis not present

## 2021-09-10 DIAGNOSIS — M47812 Spondylosis without myelopathy or radiculopathy, cervical region: Secondary | ICD-10-CM | POA: Diagnosis not present

## 2021-11-27 DIAGNOSIS — Z0289 Encounter for other administrative examinations: Secondary | ICD-10-CM | POA: Diagnosis not present

## 2021-12-24 DIAGNOSIS — M1611 Unilateral primary osteoarthritis, right hip: Secondary | ICD-10-CM | POA: Diagnosis not present

## 2021-12-24 DIAGNOSIS — M7071 Other bursitis of hip, right hip: Secondary | ICD-10-CM | POA: Diagnosis not present

## 2021-12-24 DIAGNOSIS — M25552 Pain in left hip: Secondary | ICD-10-CM | POA: Diagnosis not present

## 2021-12-24 DIAGNOSIS — Z79899 Other long term (current) drug therapy: Secondary | ICD-10-CM | POA: Diagnosis not present

## 2021-12-24 DIAGNOSIS — M25551 Pain in right hip: Secondary | ICD-10-CM | POA: Diagnosis not present

## 2021-12-24 DIAGNOSIS — I10 Essential (primary) hypertension: Secondary | ICD-10-CM | POA: Diagnosis not present

## 2021-12-25 DIAGNOSIS — I10 Essential (primary) hypertension: Secondary | ICD-10-CM | POA: Diagnosis not present

## 2021-12-25 DIAGNOSIS — M545 Low back pain, unspecified: Secondary | ICD-10-CM | POA: Diagnosis not present

## 2021-12-27 DIAGNOSIS — M545 Low back pain, unspecified: Secondary | ICD-10-CM | POA: Diagnosis not present

## 2021-12-28 DIAGNOSIS — M5416 Radiculopathy, lumbar region: Secondary | ICD-10-CM | POA: Diagnosis not present

## 2021-12-31 DIAGNOSIS — M545 Low back pain, unspecified: Secondary | ICD-10-CM | POA: Diagnosis not present

## 2022-01-02 DIAGNOSIS — M25551 Pain in right hip: Secondary | ICD-10-CM | POA: Diagnosis not present

## 2022-01-02 DIAGNOSIS — Z79899 Other long term (current) drug therapy: Secondary | ICD-10-CM | POA: Diagnosis not present

## 2022-01-02 DIAGNOSIS — Z5181 Encounter for therapeutic drug level monitoring: Secondary | ICD-10-CM | POA: Diagnosis not present

## 2022-01-02 DIAGNOSIS — M79604 Pain in right leg: Secondary | ICD-10-CM | POA: Diagnosis not present

## 2022-01-02 DIAGNOSIS — M5417 Radiculopathy, lumbosacral region: Secondary | ICD-10-CM | POA: Diagnosis not present

## 2022-01-02 DIAGNOSIS — M47812 Spondylosis without myelopathy or radiculopathy, cervical region: Secondary | ICD-10-CM | POA: Diagnosis not present

## 2022-01-07 DIAGNOSIS — M545 Low back pain, unspecified: Secondary | ICD-10-CM | POA: Diagnosis not present

## 2022-01-07 DIAGNOSIS — M5416 Radiculopathy, lumbar region: Secondary | ICD-10-CM | POA: Diagnosis not present

## 2022-01-07 DIAGNOSIS — M479 Spondylosis, unspecified: Secondary | ICD-10-CM | POA: Diagnosis not present

## 2022-01-16 DIAGNOSIS — M5451 Vertebrogenic low back pain: Secondary | ICD-10-CM | POA: Diagnosis not present

## 2022-01-16 DIAGNOSIS — M5416 Radiculopathy, lumbar region: Secondary | ICD-10-CM | POA: Diagnosis not present

## 2022-01-22 DIAGNOSIS — M5451 Vertebrogenic low back pain: Secondary | ICD-10-CM | POA: Diagnosis not present

## 2022-01-22 DIAGNOSIS — M5416 Radiculopathy, lumbar region: Secondary | ICD-10-CM | POA: Diagnosis not present

## 2022-01-24 DIAGNOSIS — M5451 Vertebrogenic low back pain: Secondary | ICD-10-CM | POA: Diagnosis not present

## 2022-01-24 DIAGNOSIS — M5416 Radiculopathy, lumbar region: Secondary | ICD-10-CM | POA: Diagnosis not present

## 2022-01-25 IMAGING — DX DG CHEST 2V
2 series · 2 of 2 positions shown · non-contrast
Comparison: None.

CLINICAL DATA: History of COVID positive with productive cough.

EXAM:
CHEST - 2 VIEW

[chest pa]
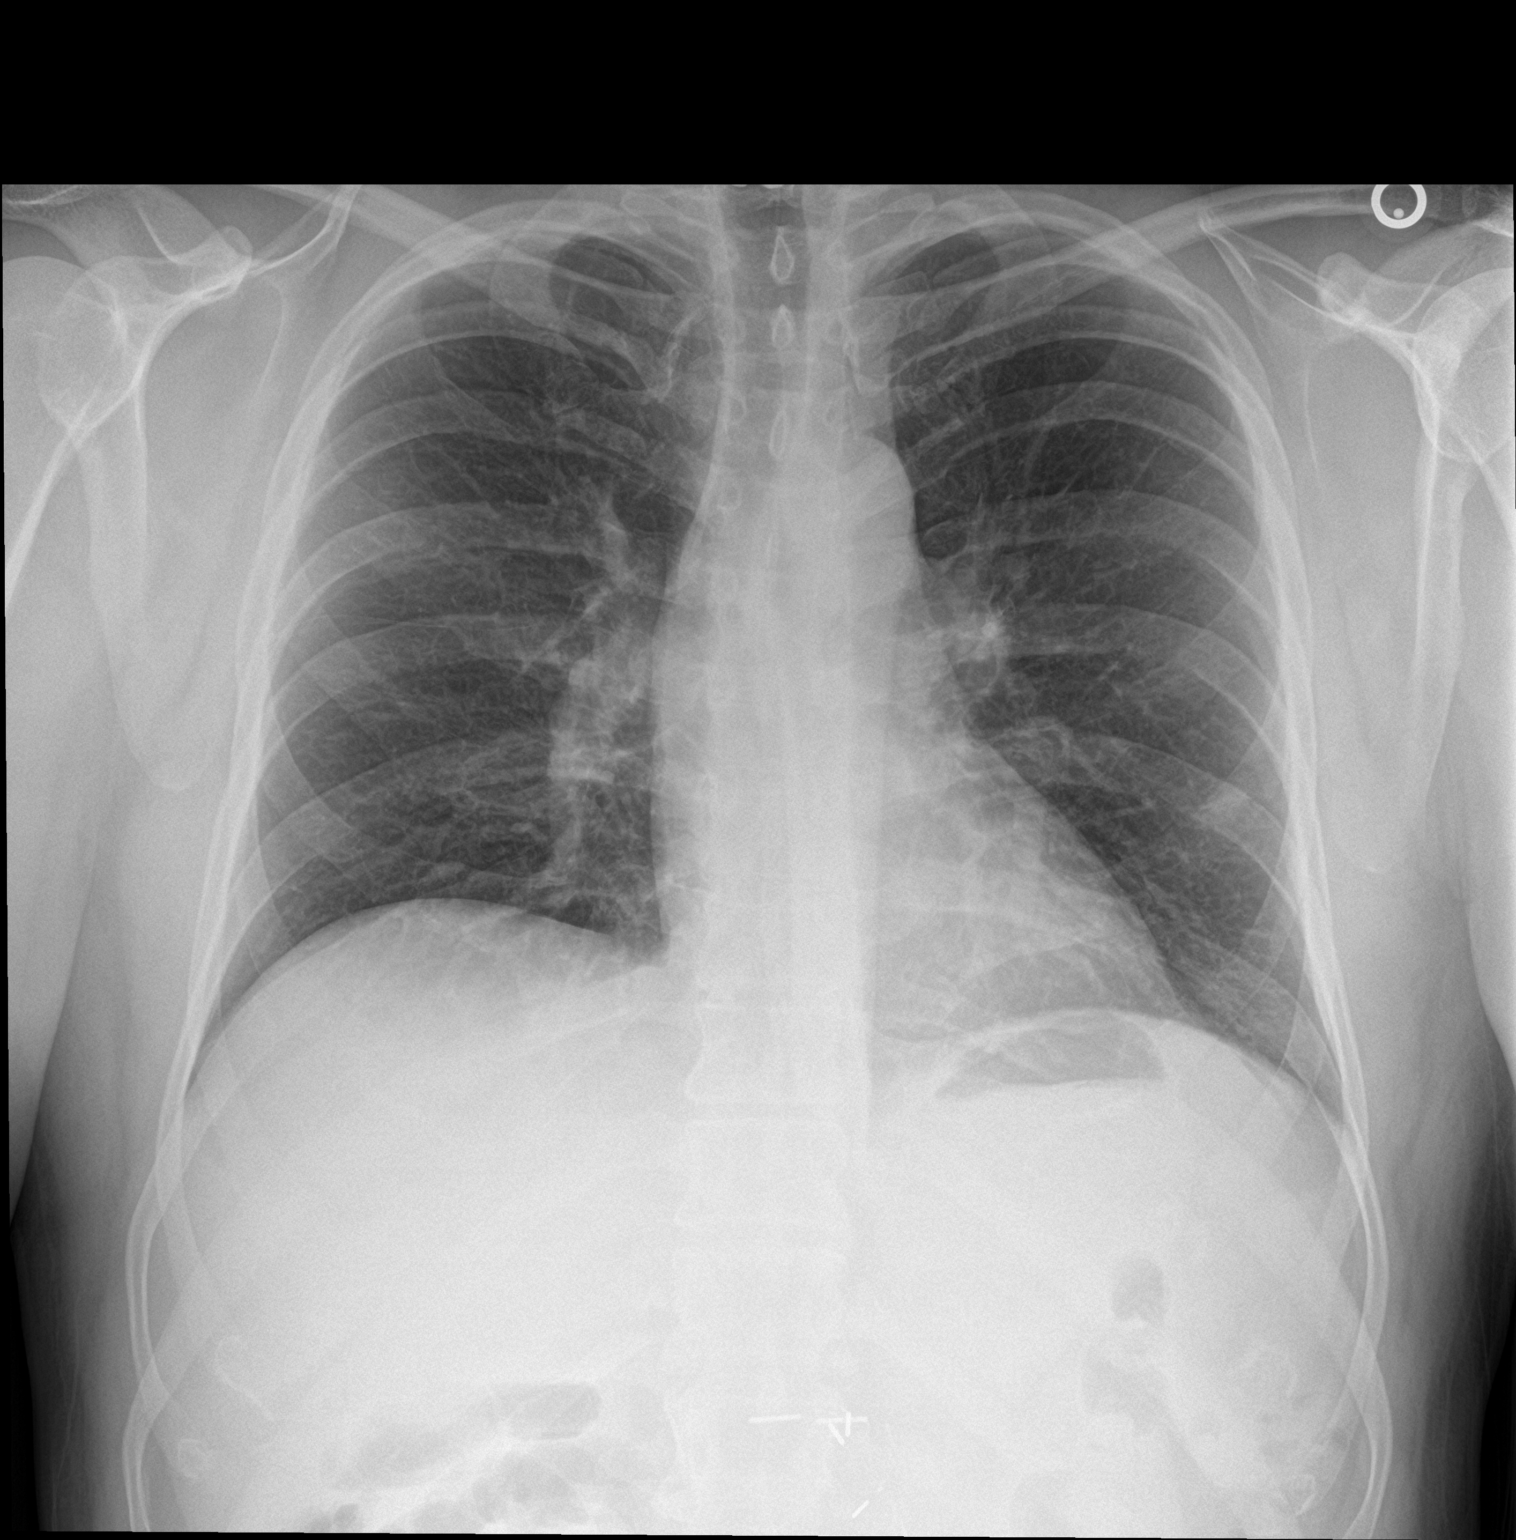

[chest lat]
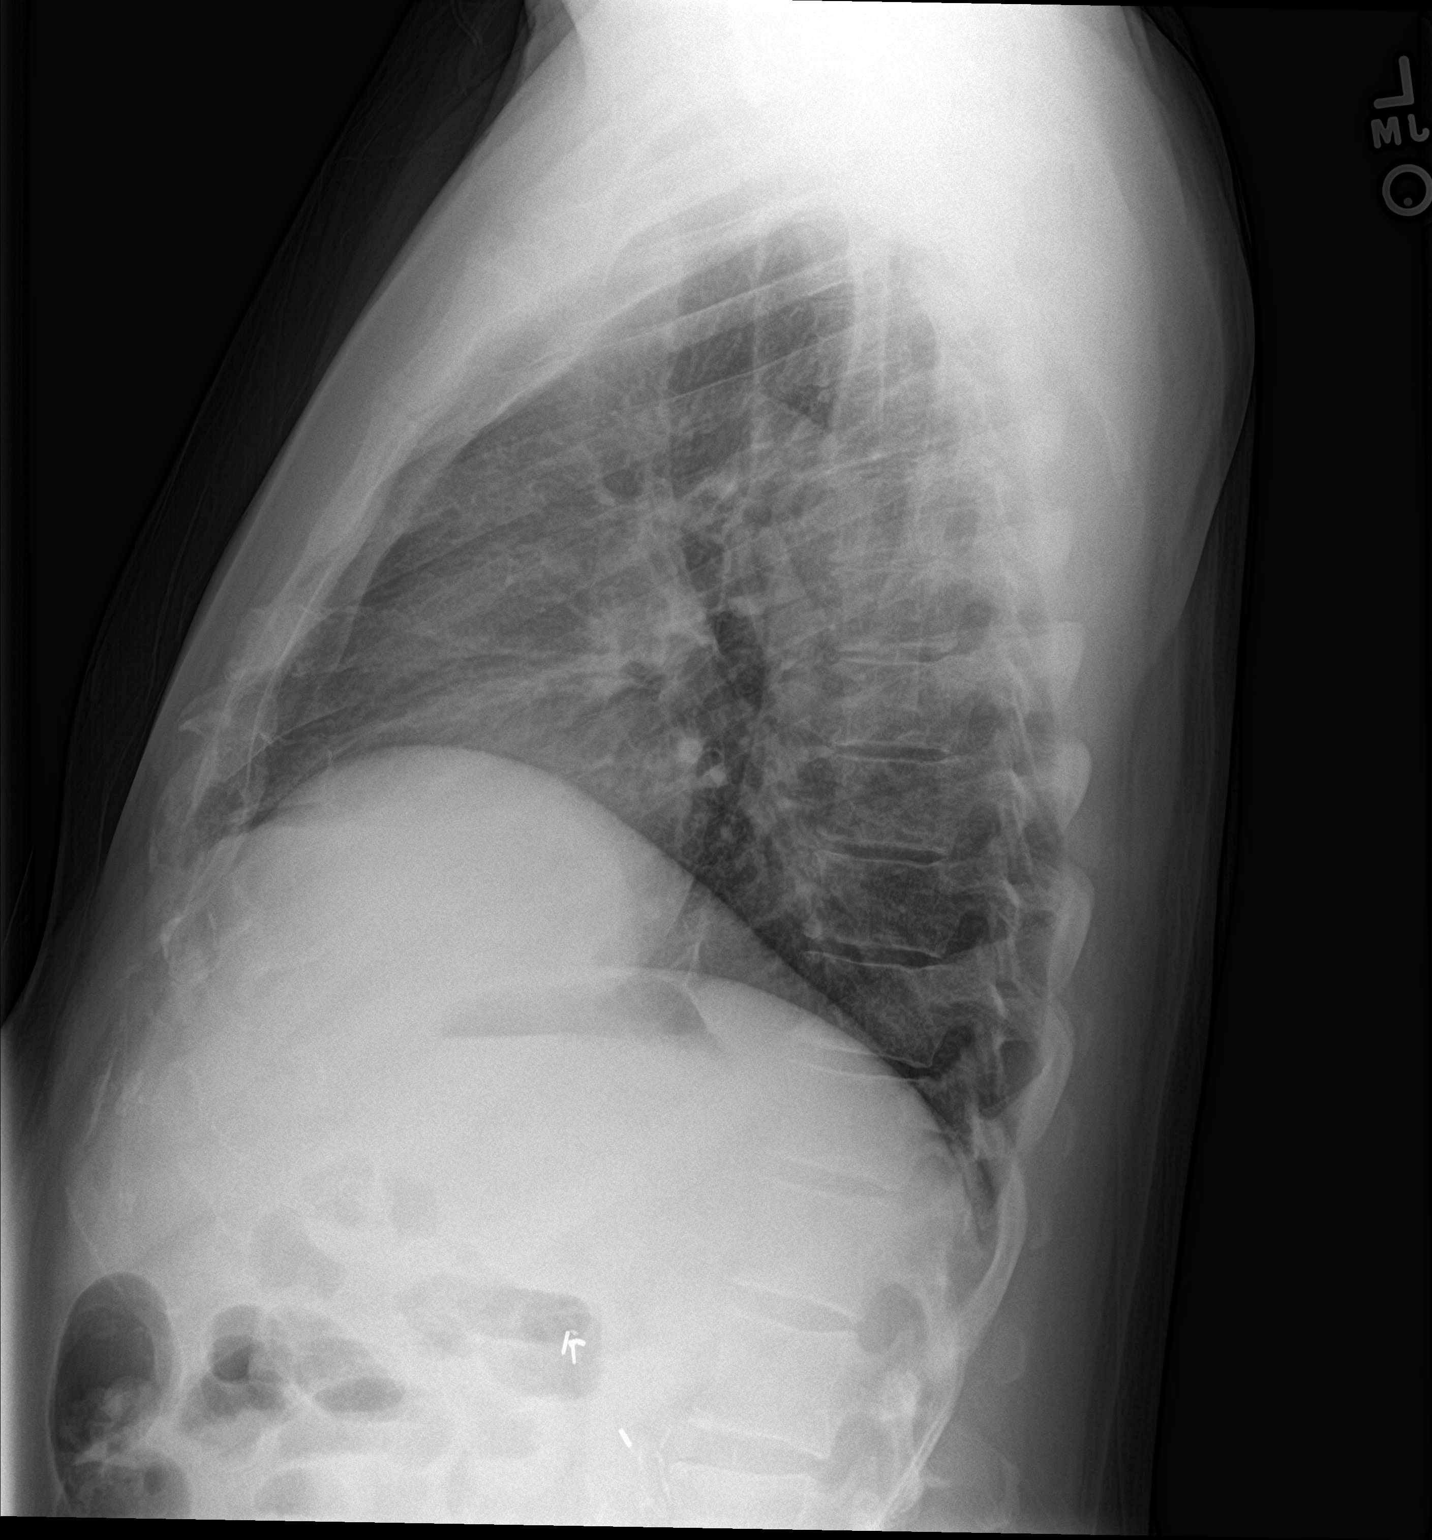

[2 of 2 positions shown; findings below may reference images not displayed]

FINDINGS: Small, ill-defined focal areas of atelectasis and/or early
infiltrate are seen within the lateral aspect of the left lung base
and mid to upper right lung. There is no evidence of a pleural
effusion or pneumothorax. The heart size and mediastinal contours
are within normal limits. A radiopaque fusion plate and screws are
seen overlying the cervical spine. Multiple surgical clips are seen
within the medial aspect of the left upper quadrant.
IMPRESSION: Very mild, bilateral atelectasis and/or early infiltrate, as
described above.

## 2022-01-29 DIAGNOSIS — M5416 Radiculopathy, lumbar region: Secondary | ICD-10-CM | POA: Diagnosis not present

## 2022-01-29 DIAGNOSIS — M5451 Vertebrogenic low back pain: Secondary | ICD-10-CM | POA: Diagnosis not present

## 2022-01-31 DIAGNOSIS — M5451 Vertebrogenic low back pain: Secondary | ICD-10-CM | POA: Diagnosis not present

## 2022-01-31 DIAGNOSIS — M5416 Radiculopathy, lumbar region: Secondary | ICD-10-CM | POA: Diagnosis not present

## 2022-02-10 DIAGNOSIS — M4726 Other spondylosis with radiculopathy, lumbar region: Secondary | ICD-10-CM | POA: Diagnosis not present

## 2022-02-24 DIAGNOSIS — M5416 Radiculopathy, lumbar region: Secondary | ICD-10-CM | POA: Diagnosis not present

## 2022-02-24 DIAGNOSIS — M479 Spondylosis, unspecified: Secondary | ICD-10-CM | POA: Diagnosis not present

## 2022-03-27 DIAGNOSIS — M25551 Pain in right hip: Secondary | ICD-10-CM | POA: Diagnosis not present

## 2022-03-27 DIAGNOSIS — Z79899 Other long term (current) drug therapy: Secondary | ICD-10-CM | POA: Diagnosis not present

## 2022-03-27 DIAGNOSIS — M5412 Radiculopathy, cervical region: Secondary | ICD-10-CM | POA: Diagnosis not present

## 2022-03-27 DIAGNOSIS — M5441 Lumbago with sciatica, right side: Secondary | ICD-10-CM | POA: Diagnosis not present

## 2022-03-27 DIAGNOSIS — Z5181 Encounter for therapeutic drug level monitoring: Secondary | ICD-10-CM | POA: Diagnosis not present

## 2022-03-27 DIAGNOSIS — M5416 Radiculopathy, lumbar region: Secondary | ICD-10-CM | POA: Diagnosis not present

## 2022-04-16 DIAGNOSIS — M47812 Spondylosis without myelopathy or radiculopathy, cervical region: Secondary | ICD-10-CM | POA: Diagnosis not present

## 2022-06-23 DIAGNOSIS — M5441 Lumbago with sciatica, right side: Secondary | ICD-10-CM | POA: Diagnosis not present

## 2022-06-23 DIAGNOSIS — M25551 Pain in right hip: Secondary | ICD-10-CM | POA: Diagnosis not present

## 2022-06-23 DIAGNOSIS — M62838 Other muscle spasm: Secondary | ICD-10-CM | POA: Diagnosis not present

## 2022-06-23 DIAGNOSIS — M5412 Radiculopathy, cervical region: Secondary | ICD-10-CM | POA: Diagnosis not present

## 2022-07-01 DIAGNOSIS — G894 Chronic pain syndrome: Secondary | ICD-10-CM | POA: Diagnosis not present

## 2022-07-01 DIAGNOSIS — M47812 Spondylosis without myelopathy or radiculopathy, cervical region: Secondary | ICD-10-CM | POA: Diagnosis not present

## 2022-07-01 DIAGNOSIS — G8929 Other chronic pain: Secondary | ICD-10-CM | POA: Diagnosis not present

## 2022-07-01 DIAGNOSIS — M5412 Radiculopathy, cervical region: Secondary | ICD-10-CM | POA: Diagnosis not present

## 2022-07-02 DIAGNOSIS — M5412 Radiculopathy, cervical region: Secondary | ICD-10-CM | POA: Diagnosis not present

## 2022-07-02 DIAGNOSIS — G8929 Other chronic pain: Secondary | ICD-10-CM | POA: Diagnosis not present

## 2022-07-02 DIAGNOSIS — G894 Chronic pain syndrome: Secondary | ICD-10-CM | POA: Diagnosis not present

## 2022-07-02 DIAGNOSIS — M47812 Spondylosis without myelopathy or radiculopathy, cervical region: Secondary | ICD-10-CM | POA: Diagnosis not present

## 2022-07-03 DIAGNOSIS — G8929 Other chronic pain: Secondary | ICD-10-CM | POA: Diagnosis not present

## 2022-07-03 DIAGNOSIS — M5412 Radiculopathy, cervical region: Secondary | ICD-10-CM | POA: Diagnosis not present

## 2022-07-03 DIAGNOSIS — G894 Chronic pain syndrome: Secondary | ICD-10-CM | POA: Diagnosis not present

## 2022-07-03 DIAGNOSIS — M47812 Spondylosis without myelopathy or radiculopathy, cervical region: Secondary | ICD-10-CM | POA: Diagnosis not present

## 2022-07-31 DIAGNOSIS — G8929 Other chronic pain: Secondary | ICD-10-CM | POA: Diagnosis not present

## 2022-07-31 DIAGNOSIS — M5412 Radiculopathy, cervical region: Secondary | ICD-10-CM | POA: Diagnosis not present

## 2022-07-31 DIAGNOSIS — G894 Chronic pain syndrome: Secondary | ICD-10-CM | POA: Diagnosis not present

## 2022-07-31 DIAGNOSIS — M47812 Spondylosis without myelopathy or radiculopathy, cervical region: Secondary | ICD-10-CM | POA: Diagnosis not present

## 2022-08-31 DIAGNOSIS — M47812 Spondylosis without myelopathy or radiculopathy, cervical region: Secondary | ICD-10-CM | POA: Diagnosis not present

## 2022-08-31 DIAGNOSIS — M5412 Radiculopathy, cervical region: Secondary | ICD-10-CM | POA: Diagnosis not present

## 2022-08-31 DIAGNOSIS — G8929 Other chronic pain: Secondary | ICD-10-CM | POA: Diagnosis not present

## 2022-08-31 DIAGNOSIS — G894 Chronic pain syndrome: Secondary | ICD-10-CM | POA: Diagnosis not present

## 2022-09-22 DIAGNOSIS — M5412 Radiculopathy, cervical region: Secondary | ICD-10-CM | POA: Diagnosis not present

## 2022-09-22 DIAGNOSIS — Z79899 Other long term (current) drug therapy: Secondary | ICD-10-CM | POA: Diagnosis not present

## 2022-09-22 DIAGNOSIS — Z5181 Encounter for therapeutic drug level monitoring: Secondary | ICD-10-CM | POA: Diagnosis not present

## 2022-09-22 DIAGNOSIS — R519 Headache, unspecified: Secondary | ICD-10-CM | POA: Diagnosis not present

## 2022-09-22 DIAGNOSIS — G8929 Other chronic pain: Secondary | ICD-10-CM | POA: Diagnosis not present

## 2022-09-30 DIAGNOSIS — M47812 Spondylosis without myelopathy or radiculopathy, cervical region: Secondary | ICD-10-CM | POA: Diagnosis not present

## 2022-09-30 DIAGNOSIS — M5412 Radiculopathy, cervical region: Secondary | ICD-10-CM | POA: Diagnosis not present

## 2022-09-30 DIAGNOSIS — G8929 Other chronic pain: Secondary | ICD-10-CM | POA: Diagnosis not present

## 2022-09-30 DIAGNOSIS — G894 Chronic pain syndrome: Secondary | ICD-10-CM | POA: Diagnosis not present

## 2022-10-13 DIAGNOSIS — M5441 Lumbago with sciatica, right side: Secondary | ICD-10-CM | POA: Diagnosis not present

## 2022-10-13 DIAGNOSIS — Z5181 Encounter for therapeutic drug level monitoring: Secondary | ICD-10-CM | POA: Diagnosis not present

## 2022-10-13 DIAGNOSIS — M5412 Radiculopathy, cervical region: Secondary | ICD-10-CM | POA: Diagnosis not present

## 2022-10-13 DIAGNOSIS — M5416 Radiculopathy, lumbar region: Secondary | ICD-10-CM | POA: Diagnosis not present

## 2022-10-13 DIAGNOSIS — Z79899 Other long term (current) drug therapy: Secondary | ICD-10-CM | POA: Diagnosis not present

## 2022-10-13 DIAGNOSIS — R519 Headache, unspecified: Secondary | ICD-10-CM | POA: Diagnosis not present

## 2022-10-13 DIAGNOSIS — G8929 Other chronic pain: Secondary | ICD-10-CM | POA: Diagnosis not present

## 2022-10-31 DIAGNOSIS — G894 Chronic pain syndrome: Secondary | ICD-10-CM | POA: Diagnosis not present

## 2022-10-31 DIAGNOSIS — M5412 Radiculopathy, cervical region: Secondary | ICD-10-CM | POA: Diagnosis not present

## 2022-10-31 DIAGNOSIS — M47812 Spondylosis without myelopathy or radiculopathy, cervical region: Secondary | ICD-10-CM | POA: Diagnosis not present

## 2022-10-31 DIAGNOSIS — G8929 Other chronic pain: Secondary | ICD-10-CM | POA: Diagnosis not present

## 2022-12-01 DIAGNOSIS — G894 Chronic pain syndrome: Secondary | ICD-10-CM | POA: Diagnosis not present

## 2022-12-01 DIAGNOSIS — M47812 Spondylosis without myelopathy or radiculopathy, cervical region: Secondary | ICD-10-CM | POA: Diagnosis not present

## 2022-12-01 DIAGNOSIS — M5412 Radiculopathy, cervical region: Secondary | ICD-10-CM | POA: Diagnosis not present

## 2022-12-01 DIAGNOSIS — G8929 Other chronic pain: Secondary | ICD-10-CM | POA: Diagnosis not present

## 2022-12-31 DIAGNOSIS — M5412 Radiculopathy, cervical region: Secondary | ICD-10-CM | POA: Diagnosis not present

## 2022-12-31 DIAGNOSIS — G894 Chronic pain syndrome: Secondary | ICD-10-CM | POA: Diagnosis not present

## 2022-12-31 DIAGNOSIS — G8929 Other chronic pain: Secondary | ICD-10-CM | POA: Diagnosis not present

## 2022-12-31 DIAGNOSIS — M47812 Spondylosis without myelopathy or radiculopathy, cervical region: Secondary | ICD-10-CM | POA: Diagnosis not present

## 2023-01-31 DIAGNOSIS — M47812 Spondylosis without myelopathy or radiculopathy, cervical region: Secondary | ICD-10-CM | POA: Diagnosis not present

## 2023-01-31 DIAGNOSIS — G894 Chronic pain syndrome: Secondary | ICD-10-CM | POA: Diagnosis not present

## 2023-01-31 DIAGNOSIS — M5412 Radiculopathy, cervical region: Secondary | ICD-10-CM | POA: Diagnosis not present

## 2023-01-31 DIAGNOSIS — G8929 Other chronic pain: Secondary | ICD-10-CM | POA: Diagnosis not present

## 2023-02-02 DIAGNOSIS — Z79899 Other long term (current) drug therapy: Secondary | ICD-10-CM | POA: Diagnosis not present

## 2023-02-02 DIAGNOSIS — M5416 Radiculopathy, lumbar region: Secondary | ICD-10-CM | POA: Diagnosis not present

## 2023-02-02 DIAGNOSIS — Z5181 Encounter for therapeutic drug level monitoring: Secondary | ICD-10-CM | POA: Diagnosis not present

## 2023-02-02 DIAGNOSIS — M47812 Spondylosis without myelopathy or radiculopathy, cervical region: Secondary | ICD-10-CM | POA: Diagnosis not present

## 2023-02-02 DIAGNOSIS — M5441 Lumbago with sciatica, right side: Secondary | ICD-10-CM | POA: Diagnosis not present

## 2023-03-02 DIAGNOSIS — M62838 Other muscle spasm: Secondary | ICD-10-CM | POA: Diagnosis not present

## 2023-03-02 DIAGNOSIS — M25552 Pain in left hip: Secondary | ICD-10-CM | POA: Diagnosis not present

## 2023-03-02 DIAGNOSIS — M5441 Lumbago with sciatica, right side: Secondary | ICD-10-CM | POA: Diagnosis not present

## 2023-03-02 DIAGNOSIS — M25551 Pain in right hip: Secondary | ICD-10-CM | POA: Diagnosis not present

## 2023-04-02 DIAGNOSIS — G8929 Other chronic pain: Secondary | ICD-10-CM | POA: Diagnosis not present

## 2023-04-02 DIAGNOSIS — M47812 Spondylosis without myelopathy or radiculopathy, cervical region: Secondary | ICD-10-CM | POA: Diagnosis not present

## 2023-04-02 DIAGNOSIS — G894 Chronic pain syndrome: Secondary | ICD-10-CM | POA: Diagnosis not present

## 2023-04-02 DIAGNOSIS — M5412 Radiculopathy, cervical region: Secondary | ICD-10-CM | POA: Diagnosis not present

## 2023-05-07 DIAGNOSIS — G894 Chronic pain syndrome: Secondary | ICD-10-CM | POA: Diagnosis not present

## 2023-05-07 DIAGNOSIS — M5416 Radiculopathy, lumbar region: Secondary | ICD-10-CM | POA: Diagnosis not present

## 2023-05-07 DIAGNOSIS — Z5181 Encounter for therapeutic drug level monitoring: Secondary | ICD-10-CM | POA: Diagnosis not present

## 2023-05-07 DIAGNOSIS — M5412 Radiculopathy, cervical region: Secondary | ICD-10-CM | POA: Diagnosis not present

## 2023-05-07 DIAGNOSIS — Z79899 Other long term (current) drug therapy: Secondary | ICD-10-CM | POA: Diagnosis not present

## 2023-05-07 DIAGNOSIS — M5441 Lumbago with sciatica, right side: Secondary | ICD-10-CM | POA: Diagnosis not present

## 2023-05-18 DIAGNOSIS — I10 Essential (primary) hypertension: Secondary | ICD-10-CM | POA: Diagnosis not present

## 2023-05-21 DIAGNOSIS — M5416 Radiculopathy, lumbar region: Secondary | ICD-10-CM | POA: Diagnosis not present

## 2023-08-17 DIAGNOSIS — Z5181 Encounter for therapeutic drug level monitoring: Secondary | ICD-10-CM | POA: Diagnosis not present

## 2023-08-17 DIAGNOSIS — M5412 Radiculopathy, cervical region: Secondary | ICD-10-CM | POA: Diagnosis not present

## 2023-08-17 DIAGNOSIS — R519 Headache, unspecified: Secondary | ICD-10-CM | POA: Diagnosis not present

## 2023-08-17 DIAGNOSIS — M5416 Radiculopathy, lumbar region: Secondary | ICD-10-CM | POA: Diagnosis not present

## 2023-08-17 DIAGNOSIS — Z79899 Other long term (current) drug therapy: Secondary | ICD-10-CM | POA: Diagnosis not present

## 2023-10-08 DIAGNOSIS — M5416 Radiculopathy, lumbar region: Secondary | ICD-10-CM | POA: Diagnosis not present

## 2023-10-13 DIAGNOSIS — Z79899 Other long term (current) drug therapy: Secondary | ICD-10-CM | POA: Diagnosis not present

## 2023-10-13 DIAGNOSIS — M47812 Spondylosis without myelopathy or radiculopathy, cervical region: Secondary | ICD-10-CM | POA: Diagnosis not present

## 2023-10-13 DIAGNOSIS — Z5181 Encounter for therapeutic drug level monitoring: Secondary | ICD-10-CM | POA: Diagnosis not present

## 2023-10-13 DIAGNOSIS — G894 Chronic pain syndrome: Secondary | ICD-10-CM | POA: Diagnosis not present

## 2023-10-13 DIAGNOSIS — M542 Cervicalgia: Secondary | ICD-10-CM | POA: Diagnosis not present

## 2023-10-26 DIAGNOSIS — Z6827 Body mass index (BMI) 27.0-27.9, adult: Secondary | ICD-10-CM | POA: Diagnosis not present

## 2023-10-26 DIAGNOSIS — I10 Essential (primary) hypertension: Secondary | ICD-10-CM | POA: Diagnosis not present

## 2024-02-25 DIAGNOSIS — H7292 Unspecified perforation of tympanic membrane, left ear: Secondary | ICD-10-CM | POA: Diagnosis not present

## 2024-02-25 DIAGNOSIS — Z6828 Body mass index (BMI) 28.0-28.9, adult: Secondary | ICD-10-CM | POA: Diagnosis not present

## 2024-02-25 DIAGNOSIS — H66002 Acute suppurative otitis media without spontaneous rupture of ear drum, left ear: Secondary | ICD-10-CM | POA: Diagnosis not present
